# Patient Record
Sex: Male | Born: 1950 | Race: White | Hispanic: No | Marital: Married | Smoking: Never smoker
Health system: Southern US, Community
[De-identification: ages and names within clinical notes are randomized; demographics above are authoritative.]

## PROBLEM LIST (undated history)

## (undated) DIAGNOSIS — N138 Other obstructive and reflux uropathy: Secondary | ICD-10-CM

## (undated) DIAGNOSIS — E785 Hyperlipidemia, unspecified: Secondary | ICD-10-CM

## (undated) DIAGNOSIS — M1990 Unspecified osteoarthritis, unspecified site: Secondary | ICD-10-CM

## (undated) DIAGNOSIS — Z789 Other specified health status: Secondary | ICD-10-CM

## (undated) DIAGNOSIS — Z86018 Personal history of other benign neoplasm: Secondary | ICD-10-CM

## (undated) DIAGNOSIS — I10 Essential (primary) hypertension: Secondary | ICD-10-CM

## (undated) DIAGNOSIS — N401 Enlarged prostate with lower urinary tract symptoms: Secondary | ICD-10-CM

## (undated) DIAGNOSIS — Z87442 Personal history of urinary calculi: Secondary | ICD-10-CM

## (undated) DIAGNOSIS — R339 Retention of urine, unspecified: Secondary | ICD-10-CM

## (undated) DIAGNOSIS — N200 Calculus of kidney: Secondary | ICD-10-CM

## (undated) HISTORY — PX: OTHER SURGICAL HISTORY: SHX169

---

## 2002-10-22 ENCOUNTER — Encounter: Admission: RE | Admit: 2002-10-22 | Discharge: 2002-10-22 | Payer: Self-pay | Admitting: Sports Medicine

## 2003-08-12 ENCOUNTER — Ambulatory Visit (HOSPITAL_COMMUNITY): Admission: RE | Admit: 2003-08-12 | Discharge: 2003-08-12 | Payer: Self-pay | Admitting: Gastroenterology

## 2005-08-06 ENCOUNTER — Emergency Department (HOSPITAL_COMMUNITY): Admission: EM | Admit: 2005-08-06 | Discharge: 2005-08-06 | Payer: Self-pay | Admitting: *Deleted

## 2005-11-07 ENCOUNTER — Other Ambulatory Visit: Admission: RE | Admit: 2005-11-07 | Discharge: 2005-11-07 | Payer: Self-pay | Admitting: Diagnostic Radiology

## 2005-12-31 ENCOUNTER — Encounter (INDEPENDENT_AMBULATORY_CARE_PROVIDER_SITE_OTHER): Payer: Self-pay | Admitting: Specialist

## 2005-12-31 ENCOUNTER — Ambulatory Visit (HOSPITAL_COMMUNITY): Admission: RE | Admit: 2005-12-31 | Discharge: 2006-01-01 | Payer: Self-pay | Admitting: Surgery

## 2005-12-31 HISTORY — PX: OTHER SURGICAL HISTORY: SHX169

## 2006-06-11 ENCOUNTER — Encounter: Admission: RE | Admit: 2006-06-11 | Discharge: 2006-06-11 | Payer: Self-pay | Admitting: Sports Medicine

## 2006-08-10 HISTORY — PX: KNEE ARTHROSCOPY W/ MENISCECTOMY: SHX1879

## 2007-07-16 ENCOUNTER — Encounter: Admission: RE | Admit: 2007-07-16 | Discharge: 2007-07-16 | Payer: Self-pay | Admitting: Orthopaedic Surgery

## 2010-03-11 HISTORY — PX: OTHER SURGICAL HISTORY: SHX169

## 2010-07-27 NOTE — Op Note (Signed)
Ricardo Little, Ricardo Little NO.:  [id number]   MEDICAL RECORD NO.:  [id number]          PATIENT TYPE:  OIB   LOCATION:  1615                         FACILITY:  [facility]   PHYSICIAN:  Sandria Bales. Ezzard Standing, M.D.  DATE OF BIRTH:  Apr 05, 1950   DATE OF PROCEDURE:  12/31/2005  DATE OF DISCHARGE:  01/01/2006                                 OPERATIVE REPORT   PREOPERATIVE DIAGNOSIS:  Left thyroid nodule.  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 Jimmy Little).   OPERATION PERFORMED:  Left thyroid lobectomy.   SURGEON:  Sandria Bales. Ezzard Standing, M.D.   ASSISTANT:  Ardeth Sportsman, MD   ANESTHESIA:  General endotracheal.   ESTIMATED BLOOD LOSS:  75 mL.   DRAINS:  None.   INDICATIONS FOR PROCEDURE:  Ricardo Little is a 60 year old white male who has  been found to have a nodule of his left thyroid lobe measuring about 4 cm in  maximum diameter.  He underwent a needle biopsy at [facility]  which showed follicular cells in a nonspecific pattern and he now comes for  a left thyroid lobectomy.   DESCRIPTION OF PROCEDURE:  The indications and potential complications of  the procedure were explained to the patient.  Potential complications  include but not limited to bleeding, infection, recurrent laryngeal nerve  and parathyroid gland injury and the possibilities of malignancy, total  thyroid gland excision.   OPERATIVE NOTE:  The patient was placed in supine position with a roll under  the shoulders. After the neck was prepped with Betadine solution and  sterilely draped, a collar neck incision was made from the anterior edge of  the left sternocleidomastoid to the anterior edge of the right  sternocleidomastoid muscle.  Sharp dissection carried down through the  platysma identifying the strap muscles which were split in the midline and  then I started dissection over the left thyroid lobe.   I identified the superior vessels of the left thyroid gland.  I ligated them  with a 2-0 silk and placed a medium sized clip on them.  I identified the  middle thyroid vein which was divided using a large clip.  The inferior  thyroid vessels also divided with a clip and a 2-0 silk tie.   I thought I found at least one of the parathyroid glands which was the  superior thyroid gland and the vessels coming out lateral to the thyroid.  I  found the recurrent laryngeal nerve which was kept posterior to dissection  and I thought we stayed away from it during the dissection.  I then got up  the ligaments of Berry attached to the trachea.  These were taken down both  with knife and Bovie electrocautery.  I then got to the isthmus.  The  patient also had a fairly good sized pyramidal lobe of the thyroid which was  probably about 2 or 3 cm in length and this pyramidal lobe was excised with  the left thyroid lobe.   The specimen was then marked with a long suture for the inferior pole, a  short suture for the isthmus and sent to pathology.   Frozen section report by Dr. Jimmy Little showed this to be a Hurthle cell  neoplasm which has benign features with permanent pathology pending.  I  therefore terminated the operation at this time.  I irrigated the neck wound  with saline.  I placed Surgicel in the bed of the thyroid gland.  There was  no evidence of any bleeding or oozing.  The midline strap muscles were  closed with interrupted 3-0 Vicryl suture.  The platysma muscle was closed  with interrupted 3-0 Vicryl suture. The skin closed with a 5-0 Monocryl  suture, painted with tincture of benzoin and steri-stripped.   The patient tolerated the procedure well, was transported to recovery room  in good condition.  Sponge and needle counts were correct at the end of this  case.      Sandria Bales. Ezzard Standing, M.D.  Electronically Signed      DHN/MEDQ  D:  12/31/2005  T:  01/01/2006  Job:  [id number]   cc:   Marjory Lies, M.D.  Fax: [phone]

## 2011-08-22 ENCOUNTER — Other Ambulatory Visit: Payer: Self-pay | Admitting: Gastroenterology

## 2012-01-12 ENCOUNTER — Emergency Department (HOSPITAL_COMMUNITY)
Admission: EM | Admit: 2012-01-12 | Discharge: 2012-01-13 | Disposition: A | Discharge status: Home / Self Care | Payer: BC Managed Care – PPO | Attending: Emergency Medicine | Admitting: Emergency Medicine

## 2012-01-12 ENCOUNTER — Encounter (HOSPITAL_COMMUNITY): Payer: Self-pay

## 2012-01-12 DIAGNOSIS — R1031 Right lower quadrant pain: Secondary | ICD-10-CM | POA: Insufficient documentation

## 2012-01-12 DIAGNOSIS — E079 Disorder of thyroid, unspecified: Secondary | ICD-10-CM | POA: Insufficient documentation

## 2012-01-12 DIAGNOSIS — I10 Essential (primary) hypertension: Secondary | ICD-10-CM | POA: Insufficient documentation

## 2012-01-12 DIAGNOSIS — Z79899 Other long term (current) drug therapy: Secondary | ICD-10-CM | POA: Insufficient documentation

## 2012-01-12 DIAGNOSIS — Z7982 Long term (current) use of aspirin: Secondary | ICD-10-CM | POA: Insufficient documentation

## 2012-01-12 DIAGNOSIS — E7800 Pure hypercholesterolemia, unspecified: Secondary | ICD-10-CM | POA: Insufficient documentation

## 2012-01-12 DIAGNOSIS — Z87442 Personal history of urinary calculi: Secondary | ICD-10-CM | POA: Insufficient documentation

## 2012-01-12 HISTORY — DX: Essential (primary) hypertension: I10

## 2012-01-12 LAB — URINALYSIS, ROUTINE W REFLEX MICROSCOPIC
Bilirubin Urine: NEGATIVE
Ketones, ur: NEGATIVE mg/dL
Nitrite: NEGATIVE
Protein, ur: NEGATIVE mg/dL
Urobilinogen, UA: 0.2 mg/dL (ref 0.0–1.0)

## 2012-01-12 NOTE — ED Notes (Signed)
Pt presents with NAD- c/o of rt flank pain x 1 day-denies kidney stone pain " have had them before and this is not the same".  Pt ?gas but worse.  Pt denies N/V/d and fever

## 2012-01-13 LAB — LIPASE, BLOOD: Lipase: 38 U/L (ref 11–59)

## 2012-01-13 LAB — COMPREHENSIVE METABOLIC PANEL
Alkaline Phosphatase: 69 U/L (ref 39–117)
BUN: 19 mg/dL (ref 6–23)
CO2: 25 mEq/L (ref 19–32)
Chloride: 102 mEq/L (ref 96–112)
GFR calc Af Amer: >90 mL/min (ref >90)
GFR calc non Af Amer: 87 mL/min — ABNORMAL LOW (ref >90)
Glucose, Bld: 97 mg/dL (ref 70–99)
Potassium: 4 mEq/L (ref 3.5–5.1)
Total Bilirubin: 0.6 mg/dL (ref 0.3–1.2)
Total Protein: 7.3 g/dL (ref 6.0–8.3)

## 2012-01-13 LAB — CBC WITH DIFFERENTIAL/PLATELET
Eosinophils Absolute: 0.1 10*3/uL (ref 0.0–0.7)
Hemoglobin: 15.4 g/dL (ref 13.0–17.0)
Lymphs Abs: 1.2 10*3/uL (ref 0.7–4.0)
MCH: 30.7 pg (ref 26.0–34.0)
MCV: 87.3 fL (ref 78.0–100.0)
Monocytes Relative: 14 % — ABNORMAL HIGH (ref 3–12)
Neutrophils Relative %: 61 % (ref 43–77)
RBC: 5.02 MIL/uL (ref 4.22–5.81)

## 2012-01-13 NOTE — ED Provider Notes (Signed)
History     CSN: [id number]  Arrival date & time 01/12/12  2230   First MD Initiated Contact with Patient 01/13/12 0016      Chief Complaint  Patient presents with  . Flank Pain    (Consider location/radiation/quality/duration/timing/severity/associated sxs/prior treatment) HPI This 61 year old male has about 24 hours of gradual onset waxing and waning right lower quadrant dull aching abdominal pain and flank pain which is minimal to mild when it started and became moderately severe but is now almost gone. It was not colicky or sudden or sharp or severe like his prior kidney stone type pains. He is no dysuria or testicular pain. He is no trauma. He is no nausea vomiting diarrhea. He is no chest pain cough or shortness breath. His pain is now gone. He was worried about possibly having appendicitis but realizes the blood counts to not rule in or out appendicitis and realized the CAT scan can miss appendicitis as well. He did have right lower quadrant abdominal pain when he arrived to the emergency room if the pain is now gone. He is no back pain no pain down his legs no weakness or numbness. He is no change in bowel or bladder function. No treatment prior to arrival. Past Medical History  Diagnosis Date  . Kidney stone   . Thyroid disease   . Hypertension   . Hypercholesterolemia     Past Surgical History  Procedure Date  . Knee surgery     No family history on file.  History  Substance Use Topics  . Smoking status: Never Smoker   . Smokeless tobacco: Not on file  . Alcohol Use: Yes      Review of Systems 10 Systems reviewed and are negative for acute change except as noted in the HPI. Allergies  Review of patient's allergies indicates no known allergies.  Home Medications   Current Outpatient Rx  Name  Route  Sig  Dispense  Refill  . AMLODIPINE BESY-BENAZEPRIL HCL 5-20 MG PO CAPS   Oral   Take 1 capsule by mouth daily.         . ASPIRIN EC 81 MG PO TBEC    Oral   Take 162 mg by mouth daily.         . ATORVASTATIN CALCIUM 10 MG PO TABS   Oral   Take 5 mg by mouth daily.         . OMEGA-3 FATTY ACIDS 1000 MG PO CAPS   Oral   Take 2 g by mouth daily.         Marland Kitchen FLAX SEED OIL 1000 MG PO CAPS   Oral   Take 1 capsule by mouth daily.         . ADULT MULTIVITAMIN W/MINERALS CH   Oral   Take 1 tablet by mouth daily.         . SERTRALINE HCL 25 MG PO TABS   Oral   Take 25 mg by mouth daily.           BP 136/79  Pulse 71  Temp 98.1 F (36.7 C) (Oral)  Resp 18  Ht 5\' 9"  (1.753 m)  Wt 218 lb (98.884 kg)  BMI 32.19 kg/m2  SpO2 98%  Physical Exam  Nursing note and vitals reviewed. Constitutional:       Awake, alert, nontoxic appearance.  HENT:  Head: Atraumatic.  Eyes: Right eye exhibits no discharge. Left eye exhibits no discharge.  Neck: Neck supple.  Cardiovascular:  Normal rate and regular rhythm.   No murmur heard. Pulmonary/Chest: Effort normal and breath sounds normal. No respiratory distress. He has no wheezes. He has no rales. He exhibits no tenderness.  Abdominal: Soft. Bowel sounds are normal. He exhibits no distension and no mass. There is no tenderness. There is no rebound and no guarding.  Genitourinary:       No CVA tenderness, testicles descended and nontender, no palpable inguinal hernias.  Musculoskeletal: He exhibits no tenderness.       Baseline ROM, no obvious new focal weakness.  Neurological: He is alert.       Mental status and motor strength appears baseline for patient and situation.  Skin: No rash noted.  Psychiatric: He has a normal mood and affect.    ED Course  Procedures (including critical care time)  Labs Reviewed  CBC WITH DIFFERENTIAL - Abnormal; Notable for the following:    Monocytes Relative 14 (*)     All other components within normal limits  COMPREHENSIVE METABOLIC PANEL - Abnormal; Notable for the following:    GFR calc non Af Amer 87 (*)     All other components  within normal limits  LIPASE, BLOOD  URINALYSIS, ROUTINE W REFLEX MICROSCOPIC  LAB REPORT - SCANNED   No results found.   1. RLQ abdominal pain       MDM  Pt stable in ED with no significant deterioration in condition.  Patient / Family / Caregiver informed of clinical course, understand medical decision-making process, and agree with plan.  I doubt any other EMC precluding discharge at this time including, but not necessarily limited to the following:SBI, peritonitis.       Hurman Horn, MD 01/13/12 [phone]

## 2012-04-25 ENCOUNTER — Other Ambulatory Visit: Payer: Self-pay

## 2012-08-17 ENCOUNTER — Other Ambulatory Visit: Payer: Self-pay | Admitting: Urology

## 2012-08-20 ENCOUNTER — Emergency Department (HOSPITAL_COMMUNITY)
Admission: EM | Admit: 2012-08-20 | Discharge: 2012-08-20 | Disposition: A | Discharge status: Home / Self Care | Payer: BC Managed Care – PPO | Attending: Emergency Medicine | Admitting: Emergency Medicine

## 2012-08-20 ENCOUNTER — Encounter (HOSPITAL_BASED_OUTPATIENT_CLINIC_OR_DEPARTMENT_OTHER): Payer: Self-pay | Admitting: *Deleted

## 2012-08-20 ENCOUNTER — Encounter (HOSPITAL_COMMUNITY): Payer: Self-pay

## 2012-08-20 DIAGNOSIS — R109 Unspecified abdominal pain: Secondary | ICD-10-CM | POA: Insufficient documentation

## 2012-08-20 DIAGNOSIS — N200 Calculus of kidney: Secondary | ICD-10-CM | POA: Insufficient documentation

## 2012-08-20 DIAGNOSIS — E785 Hyperlipidemia, unspecified: Secondary | ICD-10-CM | POA: Insufficient documentation

## 2012-08-20 DIAGNOSIS — R319 Hematuria, unspecified: Secondary | ICD-10-CM | POA: Insufficient documentation

## 2012-08-20 DIAGNOSIS — Z7982 Long term (current) use of aspirin: Secondary | ICD-10-CM | POA: Insufficient documentation

## 2012-08-20 DIAGNOSIS — Z8639 Personal history of other endocrine, nutritional and metabolic disease: Secondary | ICD-10-CM | POA: Insufficient documentation

## 2012-08-20 DIAGNOSIS — R0682 Tachypnea, not elsewhere classified: Secondary | ICD-10-CM | POA: Insufficient documentation

## 2012-08-20 DIAGNOSIS — N201 Calculus of ureter: Secondary | ICD-10-CM | POA: Insufficient documentation

## 2012-08-20 DIAGNOSIS — M549 Dorsalgia, unspecified: Secondary | ICD-10-CM | POA: Insufficient documentation

## 2012-08-20 DIAGNOSIS — R112 Nausea with vomiting, unspecified: Secondary | ICD-10-CM | POA: Insufficient documentation

## 2012-08-20 DIAGNOSIS — M1710 Unilateral primary osteoarthritis, unspecified knee: Secondary | ICD-10-CM | POA: Insufficient documentation

## 2012-08-20 DIAGNOSIS — Z862 Personal history of diseases of the blood and blood-forming organs and certain disorders involving the immune mechanism: Secondary | ICD-10-CM | POA: Insufficient documentation

## 2012-08-20 DIAGNOSIS — I10 Essential (primary) hypertension: Secondary | ICD-10-CM | POA: Insufficient documentation

## 2012-08-20 DIAGNOSIS — Z79899 Other long term (current) drug therapy: Secondary | ICD-10-CM | POA: Insufficient documentation

## 2012-08-20 LAB — URINE MICROSCOPIC-ADD ON

## 2012-08-20 LAB — URINALYSIS, ROUTINE W REFLEX MICROSCOPIC
Glucose, UA: NEGATIVE mg/dL
Leukocytes, UA: NEGATIVE
Protein, ur: NEGATIVE mg/dL

## 2012-08-20 MED ORDER — HYDROMORPHONE HCL PF 1 MG/ML IJ SOLN
1.0000 mg | Freq: Once | INTRAMUSCULAR | Status: AC
  Administered 2012-08-20: 1 mg via INTRAMUSCULAR
  Filled 2012-08-20: qty 1

## 2012-08-20 MED ORDER — OXYCODONE-ACETAMINOPHEN 5-325 MG PO TABS: 1.0000 | ORAL_TABLET | Freq: Four times a day (QID) | ORAL | Status: DC | PRN

## 2012-08-20 NOTE — ED Provider Notes (Signed)
History     CSN: [id number]  Arrival date & time 08/20/12  1842   First MD Initiated Contact with Patient 08/20/12 1939      Chief Complaint  Patient presents with  . Flank Pain    (Consider location/radiation/quality/duration/timing/severity/associated sxs/prior treatment) HPI Comments: 62 year old male with a past medical history of kidney stones and right ureteral stone scheduled to be removed next week on Thursday at [facility] by Dr. Retta Diones presents to the emergency department with his wife complaining of worsening bilateral flank pain, left worse than right. Pain described as constant, dull throbbing with sharpness waxing and waning rated 10 out of 10 at times. States he has kidney stones about the left than the right, the right are very large and in his ureter. He has Vicodin at home which is expired, took 2 of them without relief. Admits to associated hematuria. Denies increased urinary frequency, urgency or dysuria. The pain became so severe earlier today it went up vomiting. Denies nausea.  Patient is a 62 y.o. male presenting with flank pain. The history is provided by the patient and the spouse.  Flank Pain Associated symptoms include nausea and vomiting. Pertinent negatives include no abdominal pain, chills or fever.    Past Medical History  Diagnosis Date  . Hypertension   . Right ureteral stone   . History of benign thyroid tumor   . Arthritis     KNEE  . Hyperlipidemia     Past Surgical History  Procedure Laterality Date  . Knee arthroscopy w/ meniscectomy  JUNE 2008  . Left thyroid lobectomy  12-31-2005    HURTHLE CELL TUMOR    History reviewed. No pertinent family history.  History  Substance Use Topics  . Smoking status: Never Smoker   . Smokeless tobacco: Never Used  . Alcohol Use: Yes     Comment: RARE      Review of Systems  Constitutional: Negative for fever and chills.  Gastrointestinal: Positive for nausea and vomiting. Negative  for abdominal pain.  Genitourinary: Positive for hematuria and flank pain. Negative for dysuria, urgency, frequency and difficulty urinating.  Musculoskeletal: Positive for back pain.  All other systems reviewed and are negative.    Allergies  Review of patient's allergies indicates no known allergies.  Home Medications   Current Outpatient Rx  Name  Route  Sig  Dispense  Refill  . amLODipine-benazepril (LOTREL) 5-20 MG per capsule   Oral   Take 1 capsule by mouth daily.         Marland Kitchen aspirin EC 81 MG tablet   Oral   Take 81 mg by mouth daily.          Marland Kitchen atorvastatin (LIPITOR) 10 MG tablet   Oral   Take 5 mg by mouth daily.         . fexofenadine-pseudoephedrine (ALLEGRA-D 24) 180-240 MG per 24 hr tablet   Oral   Take 1 tablet by mouth daily.         . fish oil-omega-3 fatty acids 1000 MG capsule   Oral   Take 2 g by mouth daily.         . Flaxseed, Linseed, (FLAX SEED OIL) 1000 MG CAPS   Oral   Take 1 capsule by mouth daily.         . Multiple Vitamin (MULTIVITAMIN WITH MINERALS) TABS   Oral   Take 1 tablet by mouth daily.         . sertraline (ZOLOFT) 50  MG tablet   Oral   Take 25 mg by mouth daily.         . tamsulosin (FLOMAX) 0.4 MG CAPS   Oral   Take 0.4 mg by mouth daily.           BP 151/83  Pulse 65  Temp(Src) 98.7 F (37.1 C) (Oral)  Resp 24  Ht 5\' 9"  (1.753 m)  Wt 220 lb (99.791 kg)  BMI 32.47 kg/m2  SpO2 98%  Physical Exam  Nursing note and vitals reviewed. Constitutional: He is oriented to person, place, and time. He appears well-developed and well-nourished.  Appears uncomfortable.  HENT:  Head: Normocephalic and atraumatic.  Mouth/Throat: Oropharynx is clear and moist.  Eyes: Conjunctivae are normal.  Neck: Normal range of motion. Neck supple.  Cardiovascular: Normal rate, regular rhythm, normal heart sounds and intact distal pulses.   Pulmonary/Chest: Breath sounds normal. Tachypnea noted. He has no decreased breath  sounds. He has no wheezes. He has no rhonchi. He has no rales.  Abdominal: Soft. Normal appearance and bowel sounds are normal. He exhibits no distension and no mass. There is no tenderness. There is CVA tenderness (L>R). There is no rigidity, no rebound and no guarding.  Musculoskeletal: Normal range of motion. He exhibits no edema.  Neurological: He is alert and oriented to person, place, and time.  Skin: Skin is warm and dry. He is not diaphoretic.  Psychiatric: He has a normal mood and affect. His behavior is normal.    ED Course  Procedures (including critical care time)  Labs Reviewed  URINALYSIS, ROUTINE W REFLEX MICROSCOPIC - Abnormal; Notable for the following:    APPearance CLOUDY (*)    Hgb urine dipstick LARGE (*)    All other components within normal limits  URINE MICROSCOPIC-ADD ON   No results found.   1. Kidney stone   2. Ureteral stone   3. Flank pain       MDM  62 y/o male with known kidney stones, worsening pain, expired pain medication at home. Scheduled for procedure next Thursday with [facility]. Pain controlled in ED with dilaudid IM. No UTI. He is in NAD. Stable for discharge. Rx percocet for pain. Will call Dr. Retta Diones tomorrow. Return precautions discussed. Patient states understanding of plan and is agreeable.        Trevor Mace, PA-C 08/20/12 2148

## 2012-08-20 NOTE — Progress Notes (Signed)
NPO AFTER MN WITH EXCEPTION CLEAR LIQUIDS UNTIL 0830 (NO CREAM/ MILK PRODUCTS). ARRIVE AT 1315. NEEDS ISTAT AND EKG.

## 2012-08-20 NOTE — ED Notes (Signed)
Pt dx with kidney stones on the right side, has an appt next Thursday with [facility], today the pain began to get worse and he has vomited

## 2012-08-20 NOTE — ED Provider Notes (Signed)
Medical screening examination/treatment/procedure(s) were performed by non-physician practitioner and as supervising physician I was immediately available for consultation/collaboration.   Evan Osburn H Nattaly Yebra, MD 08/20/12 2318 

## 2012-08-27 ENCOUNTER — Ambulatory Visit (HOSPITAL_COMMUNITY): Payer: BC Managed Care – PPO

## 2012-08-27 ENCOUNTER — Ambulatory Visit (HOSPITAL_BASED_OUTPATIENT_CLINIC_OR_DEPARTMENT_OTHER): Payer: BC Managed Care – PPO | Admitting: Certified Registered"

## 2012-08-27 ENCOUNTER — Ambulatory Visit (HOSPITAL_BASED_OUTPATIENT_CLINIC_OR_DEPARTMENT_OTHER)
Admission: RE | Admit: 2012-08-27 | Discharge: 2012-08-27 | Disposition: A | Discharge status: Home / Self Care | Payer: BC Managed Care – PPO | Source: Ambulatory Visit | Attending: Urology | Admitting: Urology

## 2012-08-27 ENCOUNTER — Encounter (HOSPITAL_BASED_OUTPATIENT_CLINIC_OR_DEPARTMENT_OTHER): Payer: Self-pay | Admitting: *Deleted

## 2012-08-27 ENCOUNTER — Encounter (HOSPITAL_BASED_OUTPATIENT_CLINIC_OR_DEPARTMENT_OTHER): Payer: Self-pay | Admitting: Certified Registered"

## 2012-08-27 ENCOUNTER — Encounter (HOSPITAL_BASED_OUTPATIENT_CLINIC_OR_DEPARTMENT_OTHER)
Admission: RE | Disposition: A | Discharge status: Home / Self Care | Payer: Self-pay | Source: Ambulatory Visit | Attending: Urology

## 2012-08-27 DIAGNOSIS — I10 Essential (primary) hypertension: Secondary | ICD-10-CM | POA: Insufficient documentation

## 2012-08-27 DIAGNOSIS — E785 Hyperlipidemia, unspecified: Secondary | ICD-10-CM | POA: Insufficient documentation

## 2012-08-27 DIAGNOSIS — N201 Calculus of ureter: Secondary | ICD-10-CM

## 2012-08-27 HISTORY — PX: CYSTOSCOPY WITH RETROGRADE PYELOGRAM, URETEROSCOPY AND STENT PLACEMENT: SHX5789

## 2012-08-27 HISTORY — DX: Unspecified osteoarthritis, unspecified site: M19.90

## 2012-08-27 HISTORY — PX: HOLMIUM LASER APPLICATION: SHX5852

## 2012-08-27 HISTORY — DX: Hyperlipidemia, unspecified: E78.5

## 2012-08-27 HISTORY — DX: Personal history of other benign neoplasm: Z86.018

## 2012-08-27 LAB — POCT I-STAT 4, (NA,K, GLUC, HGB,HCT): Glucose, Bld: 97 mg/dL (ref 70–99)

## 2012-08-27 SURGERY — CYSTOURETEROSCOPY, WITH RETROGRADE PYELOGRAM AND STENT INSERTION
Anesthesia: General | Site: Ureter | Laterality: Bilateral | Wound class: Clean Contaminated

## 2012-08-27 MED ORDER — ONDANSETRON HCL 4 MG/2ML IJ SOLN
4.0000 mg | Freq: Four times a day (QID) | INTRAMUSCULAR | Status: DC | PRN
  Filled 2012-08-27: qty 2

## 2012-08-27 MED ORDER — LIDOCAINE HCL (CARDIAC) 20 MG/ML IV SOLN
INTRAVENOUS | Status: DC | PRN
  Administered 2012-08-27: 80 mg via INTRAVENOUS

## 2012-08-27 MED ORDER — SODIUM CHLORIDE 0.9 % IJ SOLN
3.0000 mL | Freq: Two times a day (BID) | INTRAMUSCULAR | Status: DC
  Filled 2012-08-27: qty 3

## 2012-08-27 MED ORDER — CIPROFLOXACIN IN D5W 400 MG/200ML IV SOLN
400.0000 mg | INTRAVENOUS | Status: AC
  Administered 2012-08-27: 400 mg via INTRAVENOUS
  Filled 2012-08-27: qty 200

## 2012-08-27 MED ORDER — FENTANYL CITRATE 0.05 MG/ML IJ SOLN
INTRAMUSCULAR | Status: DC | PRN
  Administered 2012-08-27: 100 ug via INTRAVENOUS
  Administered 2012-08-27: 50 ug via INTRAVENOUS
  Administered 2012-08-27: 25 ug via INTRAVENOUS
  Administered 2012-08-27 (×2): 50 ug via INTRAVENOUS
  Administered 2012-08-27: 25 ug via INTRAVENOUS

## 2012-08-27 MED ORDER — ACETAMINOPHEN 650 MG RE SUPP
650.0000 mg | RECTAL | Status: DC | PRN
  Filled 2012-08-27: qty 1

## 2012-08-27 MED ORDER — OXYBUTYNIN CHLORIDE 5 MG PO TABS: 5.0000 mg | ORAL_TABLET | Freq: Three times a day (TID) | ORAL | Status: DC

## 2012-08-27 MED ORDER — PROMETHAZINE HCL 25 MG/ML IJ SOLN
6.2500 mg | INTRAMUSCULAR | Status: DC | PRN
  Filled 2012-08-27: qty 1

## 2012-08-27 MED ORDER — OXYCODONE-ACETAMINOPHEN 5-325 MG PO TABS: 1.0000 | ORAL_TABLET | Freq: Four times a day (QID) | ORAL | Status: DC | PRN

## 2012-08-27 MED ORDER — CIPROFLOXACIN HCL 250 MG PO TABS: 250.0000 mg | ORAL_TABLET | Freq: Two times a day (BID) | ORAL | Status: DC

## 2012-08-27 MED ORDER — SODIUM CHLORIDE 0.9 % IJ SOLN
3.0000 mL | INTRAMUSCULAR | Status: DC | PRN
  Filled 2012-08-27: qty 3

## 2012-08-27 MED ORDER — DEXAMETHASONE SODIUM PHOSPHATE 4 MG/ML IJ SOLN
INTRAMUSCULAR | Status: DC | PRN
  Administered 2012-08-27: 8 mg via INTRAVENOUS

## 2012-08-27 MED ORDER — ONDANSETRON HCL 4 MG/2ML IJ SOLN
INTRAMUSCULAR | Status: DC | PRN
  Administered 2012-08-27: 4 mg via INTRAVENOUS

## 2012-08-27 MED ORDER — PROPOFOL 10 MG/ML IV BOLUS
INTRAVENOUS | Status: DC | PRN
  Administered 2012-08-27: 200 mg via INTRAVENOUS

## 2012-08-27 MED ORDER — FENTANYL CITRATE 0.05 MG/ML IJ SOLN
25.0000 ug | INTRAMUSCULAR | Status: DC | PRN
  Filled 2012-08-27: qty 1

## 2012-08-27 MED ORDER — LACTATED RINGERS IV SOLN
INTRAVENOUS | Status: DC
  Administered 2012-08-27: 100 mL/h via INTRAVENOUS
  Administered 2012-08-27: 16:00:00 via INTRAVENOUS
  Filled 2012-08-27: qty 1000

## 2012-08-27 MED ORDER — KETOROLAC TROMETHAMINE 30 MG/ML IJ SOLN
INTRAMUSCULAR | Status: DC | PRN
  Administered 2012-08-27: 30 mg via INTRAVENOUS

## 2012-08-27 MED ORDER — SODIUM CHLORIDE 0.9 % IR SOLN
Status: DC | PRN
  Administered 2012-08-27: 6000 mL

## 2012-08-27 MED ORDER — IOHEXOL 350 MG/ML SOLN
INTRAVENOUS | Status: DC | PRN
  Administered 2012-08-27: 17 mL

## 2012-08-27 MED ORDER — STERILE WATER FOR IRRIGATION IR SOLN
Status: DC | PRN
  Administered 2012-08-27: 1000 mL

## 2012-08-27 MED ORDER — MEPERIDINE HCL 25 MG/ML IJ SOLN
6.2500 mg | INTRAMUSCULAR | Status: DC | PRN
  Filled 2012-08-27: qty 1

## 2012-08-27 MED ORDER — SODIUM CHLORIDE 0.9 % IV SOLN
250.0000 mL | INTRAVENOUS | Status: DC | PRN
Start: 2012-08-27 — End: 2012-08-27
  Filled 2012-08-27: qty 250

## 2012-08-27 MED ORDER — ACETAMINOPHEN 325 MG PO TABS
650.0000 mg | ORAL_TABLET | ORAL | Status: DC | PRN
  Filled 2012-08-27: qty 2

## 2012-08-27 MED ORDER — LACTATED RINGERS IV SOLN
INTRAVENOUS | Status: DC
  Filled 2012-08-27: qty 1000

## 2012-08-27 MED ORDER — OXYCODONE HCL 5 MG PO TABS
5.0000 mg | ORAL_TABLET | ORAL | Status: DC | PRN
  Filled 2012-08-27: qty 2

## 2012-08-27 SURGICAL SUPPLY — 25 items
ADAPTER CATH URET PLST 4-6FR (CATHETERS) IMPLANT
BAG DRAIN URO-CYSTO SKYTR STRL (DRAIN) ×2 IMPLANT
BAG URINE LEG 19OZ MD ST LTX (BAG) ×2 IMPLANT
BASKET ZERO TIP NITINOL 2.4FR (BASKET) ×4 IMPLANT
CANISTER SUCT LVC 12 LTR MEDI- (MISCELLANEOUS) ×2 IMPLANT
CATH INTERMIT  6FR 70CM (CATHETERS) ×2 IMPLANT
CLOTH BEACON ORANGE TIMEOUT ST (SAFETY) ×2 IMPLANT
DRAPE CAMERA CLOSED 9X96 (DRAPES) ×2 IMPLANT
GLOVE BIO SURGEON STRL SZ 6.5 (GLOVE) ×2 IMPLANT
GLOVE BIO SURGEON STRL SZ8 (GLOVE) ×2 IMPLANT
GLOVE INDICATOR 7.0 STRL GRN (GLOVE) ×2 IMPLANT
GOWN PREVENTION PLUS LG XLONG (DISPOSABLE) ×2 IMPLANT
GOWN STRL REIN XL XLG (GOWN DISPOSABLE) ×2 IMPLANT
GUIDEWIRE 0.038 PTFE COATED (WIRE) IMPLANT
GUIDEWIRE ANG ZIPWIRE 038X150 (WIRE) ×2 IMPLANT
GUIDEWIRE STR DUAL SENSOR (WIRE) ×2 IMPLANT
IV NS IRRIG 3000ML ARTHROMATIC (IV SOLUTION) ×4 IMPLANT
KIT BALLIN UROMAX 15FX10 (LABEL) ×1 IMPLANT
LASER FIBER DISP (UROLOGICAL SUPPLIES) ×2 IMPLANT
NS IRRIG 500ML POUR BTL (IV SOLUTION) IMPLANT
PACK CYSTOSCOPY (CUSTOM PROCEDURE TRAY) ×2 IMPLANT
SET HIGH PRES BAL DIL (LABEL) ×1
SHEATH ACCESS URETERAL 54CM (SHEATH) ×2 IMPLANT
STENT URET 6FRX24 CONTOUR (STENTS) ×4 IMPLANT
SYRINGE 10CC LL (SYRINGE) ×2 IMPLANT

## 2012-08-27 NOTE — Anesthesia Procedure Notes (Signed)
Procedure Name: LMA Insertion Date/Time: 08/27/2012 2:55 PM Performed by: Renella Cunas D Pre-anesthesia Checklist: Patient identified, Emergency Drugs available, Suction available and Patient being monitored Patient Re-evaluated:Patient Re-evaluated prior to inductionOxygen Delivery Method: Circle System Utilized Preoxygenation: Pre-oxygenation with 100% oxygen Intubation Type: IV induction Ventilation: Mask ventilation without difficulty LMA: LMA inserted LMA Size: 4.0 Number of attempts: 1 Airway Equipment and Method: bite block Placement Confirmation: positive ETCO2 Tube secured with: Tape Dental Injury: Teeth and Oropharynx as per pre-operative assessment

## 2012-08-27 NOTE — Anesthesia Postprocedure Evaluation (Signed)
Anesthesia Post Note  Patient: Ricardo Little  Procedure(s) Performed: Procedure(s) (LRB): CYSTOSCOPY WITH BILATERAL RETROGRADE PYELOGRAM, BILATERAL URETEROSCOPY , STENT PLACEMENT   (Bilateral) HOLMIUM LASER EXTRACTION OF STONES (Bilateral)  Anesthesia type: General  Patient location: PACU  Post pain: Pain level controlled  Post assessment: Post-op Vital signs reviewed  Last Vitals: BP 161/96  Pulse 69  Temp(Src) 37.2 C (Oral)  Resp 14  Ht 5\' 9"  (1.753 m)  Wt 209 lb 5 oz (94.944 kg)  BMI 30.9 kg/m2  SpO2 93%  Post vital signs: Reviewed  Level of consciousness: sedated  Complications: No apparent anesthesia complications

## 2012-08-27 NOTE — Interval H&P Note (Signed)
Pt still having left flank pain. He consents to bilateral ureteroscopy.

## 2012-08-27 NOTE — Op Note (Signed)
Preoperative diagnosis: Bilateral ureteral calculi  Postoperative diagnosis: Same   Procedure: Cystoscopy, bilateral retrograde ureteropyelograms with interpretive fluoroscopy, bilateral ureteroscopy  , holmium laser and extraction of bilateral ureteral calculi, bilateral double-J stent placements  Surgeon: Bertram Millard. Corderius Saraceni, M.D.   Anesthesia: Gen.   Complications: None  Specimen(s): Stone fragments, the patient  Drain(s): Bilateral double-J stent-24 cm x 6 French contour stent with string  Indications: 62 year-old male with recently symptomatic left ureteral stone, and persistent right distal ureteral stone. He was scheduled for elective procedure today with just his right ureteral stone, but he dropped his left ureteral stone within the past few days and has had significant pain. He presents for bilateral ureteroscopy and stone extraction.    Technique and findings: The patient was properly identified in the holding area and received preoperative IV antibiotics. He was taken the operating room where general anesthetic was administered with the LMA. He is placed in the dorsolithotomy position. Genitalia and perineum were prepped and draped. Proper timeout was then performed.  I then passed a 22 French panendoscope through his urethra. Urethra was normal, prostatic urethra was moderately obstructive with the median lobe. The bladder was entered and inspected circumferentially. No tumors were noted. There were moderate trabeculations and multiple small stones layering posteriorly on the  bladder.  I then cannulated the right ureteral orifice with an open-ended catheter. Retrograde ureteropyelogram was performed.  This revealed a filling defect in the distal ureter about 2 cm up. Proximal to this there was no significant hydronephrosis.  A left retrograde was performed. This revealed a normal ureter up to the mid ureter, were filling defect consistent with the previously diagnosed 6 mm  stone was noted. There was moderate hydroureteronephrosis proximal to this.  I then passed a guidewire up the right ureter with the assistance of an open-ended catheter. I then passed a 6 French ureteroscope up to the stone, fragmented it with the holmium laser, and extracted small fragments of the bladder. Inspection of the ureter at this point revealed no further stones.  Attention was then turned to the left ureteral stone. I passed a rigid ureteroscope as far as it would allow, without seeing the stone. At this point, felt that the stone most likely went up into the kidney. I dilated the ureter with both the inner core of a ureteral access sheath as well as a 10 cm, 15 French balloon dilator. Eventually, I negotiated the long ureteral access catheter up to the proximal ureter. The digital flexible ureteroscope was advanced into the renal pelvis. Small blood clots were picked out with the Nitinol basket. I then identified, posteriorly in the posterior calyx in the mid kidney, the stone. I was unable to fragmented due to the patient's respiratory excursion. I then snared the stone and brought down to the ureteral access sheath. I moved the access sheath a little distally in the ureter. Once this was done, I was unable to bring the stone into the ureteral access sheath. I then disassembled the basket, removed the access sheath, and then passed a rigid ureteroscope up to the stone. The stone was fragmented, the previous stone basket was removed, and then using another basket, I negotiated all of the stone fragments into the bladder. Inspection of the ureter again revealed no further stones present.  I then, using a Toomey syringe, irrigated as many fragments out of the bladder as possible. I irrigated for approximately 10 minutes. The fragments were saved. I then passed, over sensor-tip guidewire, double-J  stent 6 (24 cm x 6 [place] with a tether on) up into both ureters using fluoroscopic and cystoscopic  guidance. I let the tethers on. It into the bladder and then removed the cystoscope. I then placed an 49 French Foley catheter and hooked this to dependent drainage.  The patient tolerated procedure well. He was awakened and taken to the PACU in stable condition.

## 2012-08-27 NOTE — Anesthesia Preprocedure Evaluation (Signed)
Anesthesia Evaluation  Patient identified by MRN, date of birth, ID band Patient awake    Reviewed: Allergy & Precautions, H&P , NPO status , Patient's Chart, lab work & pertinent test results  Airway Mallampati: II TM Distance: >3 FB Neck ROM: full    Dental no notable dental hx.    Pulmonary neg pulmonary ROS,  breath sounds clear to auscultation  Pulmonary exam normal       Cardiovascular Exercise Tolerance: Good negative cardio ROS  Rhythm:regular Rate:Normal     Neuro/Psych negative neurological ROS  negative psych ROS   GI/Hepatic negative GI ROS, Neg liver ROS,   Endo/Other  negative endocrine ROS  Renal/GU negative Renal ROS  negative genitourinary   Musculoskeletal   Abdominal   Peds  Hematology negative hematology ROS (+)   Anesthesia Other Findings   Reproductive/Obstetrics negative OB ROS                           Anesthesia Physical Anesthesia Plan  ASA: I  Anesthesia Plan: General   Post-op Pain Management:    Induction:   Airway Management Planned:   Additional Equipment:   Intra-op Plan:   Post-operative Plan:   Informed Consent: I have reviewed the patients History and Physical, chart, labs and discussed the procedure including the risks, benefits and alternatives for the proposed anesthesia with the patient or authorized representative who has indicated his/her understanding and acceptance.   Dental Advisory Given  Plan Discussed with: CRNA  Anesthesia Plan Comments:         Anesthesia Quick Evaluation

## 2012-08-27 NOTE — Transfer of Care (Signed)
Immediate Anesthesia Transfer of Care Note  Patient: Ricardo Little  Procedure(s) Performed: Procedure(s) (LRB): CYSTOSCOPY WITH BILATERAL RETROGRADE PYELOGRAM, BILATERAL URETEROSCOPY , STENT PLACEMENT   (Bilateral) HOLMIUM LASER EXTRACTION OF STONES (Bilateral)  Patient Location: PACU  Anesthesia Type: General  Level of Consciousness: awake, oriented, sedated and patient cooperative  Airway & Oxygen Therapy: Patient Spontanous Breathing and Patient connected to face mask oxygen  Post-op Assessment: Report given to PACU RN and Post -op Vital signs reviewed and stable  Post vital signs: Reviewed and stable  Complications: No apparent anesthesia complications

## 2012-08-27 NOTE — H&P (Signed)
  Urology History and Physical Exam  CC: Kidney stones  HPI: 62 year old male presents at this time for definitive management of ureteral calculi. He has been trying to pass an asymptomatic 6 mm right distal ureteral stone for a few months. Unfortunately, he has had nonprogression, and recent followup revealed this persistent stone. Was recommended to have ureteroscopic stone extraction using the laser. He became symptomatic with a 6 mm proximal ureteral stone on the left recently. He saw Dr. Isabel Caprice in our office a couple of days ago, and was found to have the stone. It was recommended that he have that stone management of the same time.  PMH: Past Medical History  Diagnosis Date  . Hypertension   . Right ureteral stone   . History of benign thyroid tumor   . Arthritis     KNEE  . Hyperlipidemia     PSH: Past Surgical History  Procedure Laterality Date  . Knee arthroscopy w/ meniscectomy  JUNE 2008  . 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 Marital Status: Married    Spouse Name: N/A    Number of Children: N/A  . Years of Education: N/A   Occupational History  . Not on file.   Social History Main Topics  . Smoking status: Never Smoker   . Smokeless tobacco: Never Used  . Alcohol Use: Yes     Comment: RARE  . Drug Use: No  . Sexually Active: Not on file   Other Topics Concern  . Not on file   Social History Narrative  . No narrative on file    Family History: History reviewed. No pertinent family history.  Review of Systems: Positive: Left flank pain Negative:   A further 10 point review of systems was negative except what is listed in the HPI.  Physical Exam: @VITALS2 @ General: No acute distress.  Awake. Head:  Normocephalic.  Atraumatic. ENT:  EOMI.  Mucous membranes moist Neck:  Supple.  No  lymphadenopathy. CV:  S1 present. S2 present. Regular rate. Pulmonary: Equal effort bilaterally.  Clear to auscultation bilaterally. Abdomen: Soft.  Non- tender to palpation. Skin:  Normal turgor.  No visible rash. Extremity: No gross deformity of bilateral upper extremities.  No gross deformity of    bilateral lower extremities. Neurologic: Alert. Appropriate mood.   Studies:  No results found for this basename: HGB, WBC, PLT,  in the last 72 hours  No results found for this basename: NA, K, CL, CO2, BUN, CREATININE, CALCIUM, MAGNESIUM, GFRNONAA, GFRAA,  in the last 72 hours   No results found for this basename: PT, INR, APTT,  in the last 72 hours   No components found with this basename: ABG,     Assessment:  Bilateral ureteral calculi  Plan: Anesthetic cystoscopy, bilateral retrograde, bilateral ureteroscopic stone management with holmium laser lithotripsy and possible stent. The procedure has been discussed with the patient. He understands the risks and complications of this, and desires to proceed

## 2012-08-31 ENCOUNTER — Encounter (HOSPITAL_BASED_OUTPATIENT_CLINIC_OR_DEPARTMENT_OTHER): Payer: Self-pay | Admitting: Urology

## 2012-09-04 ENCOUNTER — Emergency Department (HOSPITAL_COMMUNITY)
Admission: EM | Admit: 2012-09-04 | Discharge: 2012-09-04 | Discharge status: Against Medical Advice | Payer: BC Managed Care – PPO | Attending: Emergency Medicine | Admitting: Emergency Medicine

## 2012-09-04 DIAGNOSIS — Z008 Encounter for other general examination: Secondary | ICD-10-CM | POA: Insufficient documentation

## 2012-09-04 NOTE — ED Notes (Signed)
No unable to be found in [facility]

## 2012-09-04 NOTE — ED Notes (Signed)
No answer in [facility]

## 2012-09-05 ENCOUNTER — Ambulatory Visit (INDEPENDENT_AMBULATORY_CARE_PROVIDER_SITE_OTHER): Payer: BC Managed Care – PPO | Admitting: Family Medicine

## 2012-09-05 VITALS — BP 142/80 | HR 85 | Temp 97.3°F | Resp 18 | Ht 70.5 in | Wt 198.0 lb

## 2012-09-05 DIAGNOSIS — E860 Dehydration: Secondary | ICD-10-CM

## 2012-09-05 DIAGNOSIS — E785 Hyperlipidemia, unspecified: Secondary | ICD-10-CM | POA: Insufficient documentation

## 2012-09-05 DIAGNOSIS — N200 Calculus of kidney: Secondary | ICD-10-CM

## 2012-09-05 DIAGNOSIS — E789 Disorder of lipoprotein metabolism, unspecified: Secondary | ICD-10-CM

## 2012-09-05 DIAGNOSIS — I10 Essential (primary) hypertension: Secondary | ICD-10-CM

## 2012-09-05 DIAGNOSIS — K219 Gastro-esophageal reflux disease without esophagitis: Secondary | ICD-10-CM

## 2012-09-05 LAB — POCT CBC
Granulocyte percent: 77.4 %G (ref 37–80)
HCT, POC: 38.5 % — AB (ref 43.5–53.7)
Hemoglobin: 12.4 g/dL — AB (ref 14.1–18.1)
Lymph, poc: 1.3 (ref 0.6–3.4)
MCH, POC: 29.7 pg (ref 27–31.2)
MCHC: 32.2 g/dL (ref 31.8–35.4)
MCV: 92 fL (ref 80–97)
MID (cbc): 0.5 (ref 0–0.9)
MPV: 9.1 fL (ref 0–99.8)
POC Granulocyte: 6.1 (ref 2–6.9)
POC LYMPH PERCENT: 15.9 %L (ref 10–50)
POC MID %: 6.7 %M (ref 0–12)
Platelet Count, POC: 266 10*3/uL (ref 142–424)
RBC: 4.18 M/uL — AB (ref 4.69–6.13)
RDW, POC: 13.1 %
WBC: 7.9 10*3/uL (ref 4.6–10.2)

## 2012-09-05 LAB — COMPREHENSIVE METABOLIC PANEL
ALT: 11 U/L (ref 0–53)
AST: 12 U/L (ref 0–37)
Albumin: 3.5 g/dL (ref 3.5–5.2)
Alkaline Phosphatase: 59 U/L (ref 39–117)
BUN: 21 mg/dL (ref 6–23)
CO2: 24 mEq/L (ref 19–32)
Calcium: 8.8 mg/dL (ref 8.4–10.5)
Chloride: 103 mEq/L (ref 96–112)
Creat: 1.31 mg/dL (ref 0.50–1.35)
Glucose, Bld: 100 mg/dL — ABNORMAL HIGH (ref 70–99)
Potassium: 4.3 mEq/L (ref 3.5–5.3)
Sodium: 138 mEq/L (ref 135–145)
Total Bilirubin: 0.7 mg/dL (ref 0.3–1.2)
Total Protein: 6.2 g/dL (ref 6.0–8.3)

## 2012-09-05 LAB — POCT UA - MICROSCOPIC ONLY
Casts, Ur, LPF, POC: NEGATIVE
Crystals, Ur, HPF, POC: NEGATIVE
Mucus, UA: NEGATIVE
Yeast, UA: NEGATIVE

## 2012-09-05 LAB — POCT URINALYSIS DIPSTICK
Bilirubin, UA: NEGATIVE
Glucose, UA: NEGATIVE
Nitrite, UA: NEGATIVE
Spec Grav, UA: 1.02
Urobilinogen, UA: 0.2
pH, UA: 7

## 2012-09-05 MED ORDER — OMEPRAZOLE 40 MG PO CPDR: 40.0000 mg | DELAYED_RELEASE_CAPSULE | Freq: Every day | ORAL | Status: DC

## 2012-09-05 NOTE — Progress Notes (Signed)
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 Patient has recently undergone lithotripsy by Dr. Retta Diones after having stones in both sides. Since that time, patient has lost 20 pounds with severe reflux, nausea, constipation, and subsequent dehydration. He's still able to pee and he said no hematuria, but he comes in in extremis.  Patient also has history of hyper lipidemia and hypertension. He's had no heart problems that he knows about. Is having no abdominal or flank pain at present but he has been weak and lightheaded. Patient went to the emergency room yesterday but got tired of waiting and left.  Objective: Patient is shaking, tearful, and clutching his chest. HEENT: Unremarkable Chest: Clear Heart: Regular no murmur Abdomen: Soft, and no HSM, no masses, nontender. CVAT is negative Skin: Some tenting of the dorsal hand, cool and dry Extremities: Unremarkable Patient was given a GI cocktail immediately with at least 50% reduction in reflux pain. Results for orders placed in visit on 09/05/12  POCT UA - MICROSCOPIC ONLY      Result Value Range   WBC, Ur, HPF, POC 5-10     RBC, urine, microscopic 40-50     Bacteria, U Microscopic trace     Mucus, UA neg     Epithelial cells, urine per micros 1-3     Crystals, Ur, HPF, POC neg     Casts, Ur, LPF, POC neg     Yeast, UA neg    POCT URINALYSIS DIPSTICK      Result Value Range   Color, UA yellow     Clarity, UA hazy     Glucose, UA neg     Bilirubin, UA neg     Ketones, UA trace     Spec Grav, UA 1.020     Blood, UA large     pH, UA 7.0     Protein, UA trace     Urobilinogen, UA 0.2     Nitrite, UA neg     Leukocytes, UA small (1+)    POCT CBC      Result Value Range   WBC 7.9  4.6 - 10.2 K/uL   Lymph, poc 1.3  0.6 - 3.4   POC LYMPH PERCENT 15.9  10 - 50 %L   MID (cbc) 0.5  0 - 0.9   POC MID % 6.7  0 - 12 %M   POC Granulocyte 6.1  2 - 6.9   Granulocyte percent 77.4  37 - 80 %G   RBC 4.18 (*) 4.69 - 6.13  M/uL   Hemoglobin 12.4 (*) 14.1 - 18.1 g/dL   HCT, POC 78.2 (*) 95.6 - 53.7 %   MCV 92.0  80 - 97 fL   MCH, POC 29.7  27 - 31.2 pg   MCHC 32.2  31.8 - 35.4 g/dL   RDW, POC 21.3     Platelet Count, POC 266  142 - 424 K/uL   MPV 9.1  0 - 99.8 fL     Assessment: Recent kidney stone, dehydration, marked GERD, and weight loss. She seems a lot more calm and stable after 2 L of fluid.  Plan: Restrict patient to pick up a proton pump inhibitor (Prilosec) and take smoothies and scrambling for the next couple days. A 1 to return if any of the symptoms returned.

## 2012-09-05 NOTE — Patient Instructions (Addendum)
I recommend at least three "smoothies"  Daily for next 3 days.  Scrambled eggs are also easily digested and will restore your nutritional balance   Dehydration, Adult Dehydration is when you lose more fluids from the body than you take in. Vital organs like the kidneys, brain, and heart cannot function without a proper amount of fluids and salt. Any loss of fluids from the body can cause dehydration.  CAUSES   Vomiting.  Diarrhea.  Excessive sweating.  Excessive urine output.  Fever. SYMPTOMS  Mild dehydration  Thirst.  Dry lips.  Slightly dry mouth. Moderate dehydration  Very dry mouth.  Sunken eyes.  Skin does not bounce back quickly when lightly pinched and released.  Dark urine and decreased urine production.  Decreased tear production.  Headache. Severe dehydration  Very dry mouth.  Extreme thirst.  Rapid, weak pulse (more than 100 beats per minute at rest).  Cold hands and feet.  Not able to sweat in spite of heat and temperature.  Rapid breathing.  Blue lips.  Confusion and lethargy.  Difficulty being awakened.  Minimal urine production.  No tears. DIAGNOSIS  Your caregiver will diagnose dehydration based on your symptoms and your exam. Blood and urine tests will help confirm the diagnosis. The diagnostic evaluation should also identify the cause of dehydration. TREATMENT  Treatment of mild or moderate dehydration can often be done at home by increasing the amount of fluids that you drink. It is best to drink small amounts of fluid more often. Drinking too much at one time can make vomiting worse. Refer to the home care instructions below. Severe dehydration needs to be treated at the hospital where you will probably be given intravenous (IV) fluids that contain water and electrolytes. HOME CARE INSTRUCTIONS   Ask your caregiver about specific rehydration instructions.  Drink enough fluids to keep your urine clear or pale yellow.  Drink  small amounts frequently if you have nausea and vomiting.  Eat as you normally do.  Avoid:  Foods or drinks high in sugar.  Carbonated drinks.  Juice.  Extremely hot or cold fluids.  Drinks with caffeine.  Fatty, greasy foods.  Alcohol.  Tobacco.  Overeating.  Gelatin desserts.  Wash your hands well to avoid spreading bacteria and viruses.  Only take over-the-counter or prescription medicines for pain, discomfort, or fever as directed by your caregiver.  Ask your caregiver if you should continue all prescribed and over-the-counter medicines.  Keep all follow-up appointments with your caregiver. SEEK MEDICAL CARE IF:  You have abdominal pain and it increases or stays in one area (localizes).  You have a rash, stiff neck, or severe headache.  You are irritable, sleepy, or difficult to awaken.  You are weak, dizzy, or extremely thirsty. SEEK IMMEDIATE MEDICAL CARE IF:   You are unable to keep fluids down or you get worse despite treatment.  You have frequent episodes of vomiting or diarrhea.  You have blood or green matter (bile) in your vomit.  You have blood in your stool or your stool looks black and tarry.  You have not urinated in 6 to 8 hours, or you have only urinated a small amount of very dark urine.  You have a fever.  You faint. MAKE SURE YOU:   Understand these instructions.  Will watch your condition.  Will get help right away if you are not doing well or get worse. Document Released: 02/25/2005 Document Revised: 05/20/2011 Document Reviewed: 10/15/2010 [facility] Patient Information 2014 [place], [place].

## 2013-01-14 ENCOUNTER — Other Ambulatory Visit: Payer: Self-pay

## 2013-05-01 ENCOUNTER — Other Ambulatory Visit: Payer: Self-pay | Admitting: Internal Medicine

## 2013-05-01 ENCOUNTER — Ambulatory Visit (HOSPITAL_COMMUNITY)
Admission: EM | Admit: 2013-05-01 | Discharge: 2013-05-02 | Disposition: A | Discharge status: Home / Self Care | Payer: BC Managed Care – PPO | Attending: Orthopedic Surgery | Admitting: Orthopedic Surgery

## 2013-05-01 ENCOUNTER — Encounter (HOSPITAL_COMMUNITY): Admission: EM | Disposition: A | Discharge status: Home / Self Care | Payer: Self-pay | Source: Home / Self Care

## 2013-05-01 ENCOUNTER — Encounter (HOSPITAL_COMMUNITY): Payer: Self-pay | Admitting: Emergency Medicine

## 2013-05-01 ENCOUNTER — Emergency Department (HOSPITAL_COMMUNITY): Payer: BC Managed Care – PPO | Admitting: Anesthesiology

## 2013-05-01 ENCOUNTER — Ambulatory Visit: Payer: BC Managed Care – PPO

## 2013-05-01 ENCOUNTER — Ambulatory Visit (INDEPENDENT_AMBULATORY_CARE_PROVIDER_SITE_OTHER): Payer: BC Managed Care – PPO | Admitting: Internal Medicine

## 2013-05-01 ENCOUNTER — Encounter (HOSPITAL_COMMUNITY): Payer: BC Managed Care – PPO | Admitting: Anesthesiology

## 2013-05-01 VITALS — BP 138/78 | HR 66 | Temp 98.2°F | Resp 18 | Ht 70.5 in | Wt 219.6 lb

## 2013-05-01 DIAGNOSIS — N201 Calculus of ureter: Secondary | ICD-10-CM | POA: Insufficient documentation

## 2013-05-01 DIAGNOSIS — Z7982 Long term (current) use of aspirin: Secondary | ICD-10-CM | POA: Insufficient documentation

## 2013-05-01 DIAGNOSIS — Y9321 Activity, ice skating: Secondary | ICD-10-CM | POA: Insufficient documentation

## 2013-05-01 DIAGNOSIS — M25539 Pain in unspecified wrist: Secondary | ICD-10-CM

## 2013-05-01 DIAGNOSIS — W19XXXA Unspecified fall, initial encounter: Secondary | ICD-10-CM | POA: Insufficient documentation

## 2013-05-01 DIAGNOSIS — S52502A Unspecified fracture of the lower end of left radius, initial encounter for closed fracture: Secondary | ICD-10-CM | POA: Diagnosis present

## 2013-05-01 DIAGNOSIS — E785 Hyperlipidemia, unspecified: Secondary | ICD-10-CM | POA: Insufficient documentation

## 2013-05-01 DIAGNOSIS — IMO0002 Reserved for concepts with insufficient information to code with codable children: Secondary | ICD-10-CM

## 2013-05-01 DIAGNOSIS — S52539A Colles' fracture of unspecified radius, initial encounter for closed fracture: Secondary | ICD-10-CM

## 2013-05-01 DIAGNOSIS — S52609A Unspecified fracture of lower end of unspecified ulna, initial encounter for closed fracture: Principal | ICD-10-CM

## 2013-05-01 DIAGNOSIS — S52509A Unspecified fracture of the lower end of unspecified radius, initial encounter for closed fracture: Secondary | ICD-10-CM | POA: Insufficient documentation

## 2013-05-01 DIAGNOSIS — M1710 Unilateral primary osteoarthritis, unspecified knee: Secondary | ICD-10-CM | POA: Insufficient documentation

## 2013-05-01 DIAGNOSIS — I10 Essential (primary) hypertension: Secondary | ICD-10-CM | POA: Insufficient documentation

## 2013-05-01 DIAGNOSIS — S52532A Colles' fracture of left radius, initial encounter for closed fracture: Secondary | ICD-10-CM

## 2013-05-01 HISTORY — PX: OPEN REDUCTION INTERNAL FIXATION (ORIF) DISTAL RADIAL FRACTURE: SHX5989

## 2013-05-01 SURGERY — OPEN REDUCTION INTERNAL FIXATION (ORIF) DISTAL RADIUS FRACTURE
Anesthesia: General | Site: Wrist | Laterality: Left

## 2013-05-01 MED ORDER — BUPIVACAINE HCL (PF) 0.25 % IJ SOLN: INTRAMUSCULAR | Status: DC | PRN

## 2013-05-01 MED ORDER — SUCCINYLCHOLINE CHLORIDE 20 MG/ML IJ SOLN
INTRAMUSCULAR | Status: AC
  Filled 2013-05-01: qty 1

## 2013-05-01 MED ORDER — ONDANSETRON HCL 4 MG/2ML IJ SOLN: 4.0000 mg | Freq: Once | INTRAMUSCULAR | Status: DC | PRN

## 2013-05-01 MED ORDER — ONDANSETRON HCL 4 MG/2ML IJ SOLN
INTRAMUSCULAR | Status: AC
  Filled 2013-05-01: qty 2

## 2013-05-01 MED ORDER — DOCUSATE SODIUM 100 MG PO CAPS
100.0000 mg | ORAL_CAPSULE | Freq: Two times a day (BID) | ORAL | Status: DC
  Administered 2013-05-01 – 2013-05-02 (×2): 100 mg via ORAL
  Filled 2013-05-01 (×2): qty 1

## 2013-05-01 MED ORDER — BENAZEPRIL HCL 20 MG PO TABS
20.0000 mg | ORAL_TABLET | Freq: Every day | ORAL | Status: DC
  Administered 2013-05-02: 20 mg via ORAL
  Filled 2013-05-01: qty 1

## 2013-05-01 MED ORDER — ONDANSETRON HCL 4 MG/2ML IJ SOLN: 4.0000 mg | Freq: Four times a day (QID) | INTRAMUSCULAR | Status: DC | PRN

## 2013-05-01 MED ORDER — 0.9 % SODIUM CHLORIDE (POUR BTL) OPTIME
TOPICAL | Status: DC | PRN
  Administered 2013-05-01: 1000 mL

## 2013-05-01 MED ORDER — DOCUSATE SODIUM 100 MG PO CAPS: 100.0000 mg | ORAL_CAPSULE | Freq: Two times a day (BID) | ORAL | Status: DC

## 2013-05-01 MED ORDER — CEFAZOLIN SODIUM 1-5 GM-% IV SOLN
1.0000 g | INTRAVENOUS | Status: AC
Start: 2013-05-01 — End: 2013-05-01
  Administered 2013-05-01: 1 g via INTRAVENOUS

## 2013-05-01 MED ORDER — LIDOCAINE HCL (CARDIAC) 20 MG/ML IV SOLN
INTRAVENOUS | Status: AC
  Filled 2013-05-01: qty 5

## 2013-05-01 MED ORDER — FENTANYL CITRATE 0.05 MG/ML IJ SOLN
INTRAMUSCULAR | Status: AC
  Filled 2013-05-01: qty 5

## 2013-05-01 MED ORDER — ROCURONIUM BROMIDE 50 MG/5ML IV SOLN
INTRAVENOUS | Status: AC
  Filled 2013-05-01: qty 1

## 2013-05-01 MED ORDER — METHOCARBAMOL 500 MG PO TABS: 500.0000 mg | ORAL_TABLET | Freq: Four times a day (QID) | ORAL | Status: DC

## 2013-05-01 MED ORDER — ONDANSETRON HCL 4 MG/2ML IJ SOLN
INTRAMUSCULAR | Status: DC | PRN
  Administered 2013-05-01: 4 mg via INTRAVENOUS

## 2013-05-01 MED ORDER — VITAMIN C 500 MG PO TABS
1000.0000 mg | ORAL_TABLET | Freq: Every day | ORAL | Status: DC
  Administered 2013-05-01 – 2013-05-02 (×2): 1000 mg via ORAL
  Filled 2013-05-01 (×2): qty 2

## 2013-05-01 MED ORDER — TAMSULOSIN HCL 0.4 MG PO CAPS
0.4000 mg | ORAL_CAPSULE | Freq: Every day | ORAL | Status: DC
  Administered 2013-05-02: 0.4 mg via ORAL
  Filled 2013-05-01: qty 1

## 2013-05-01 MED ORDER — CEFAZOLIN SODIUM-DEXTROSE 2-3 GM-% IV SOLR
INTRAVENOUS | Status: AC
  Administered 2013-05-01: 2 g via INTRAVENOUS
  Filled 2013-05-01: qty 50

## 2013-05-01 MED ORDER — ADULT MULTIVITAMIN W/MINERALS CH
1.0000 | ORAL_TABLET | Freq: Every day | ORAL | Status: DC
  Administered 2013-05-02: 1 via ORAL
  Filled 2013-05-01: qty 1

## 2013-05-01 MED ORDER — PROPOFOL 10 MG/ML IV BOLUS
INTRAVENOUS | Status: AC
  Filled 2013-05-01: qty 20

## 2013-05-01 MED ORDER — OXYCODONE-ACETAMINOPHEN 10-325 MG PO TABS: 1.0000 | ORAL_TABLET | ORAL | Status: DC | PRN

## 2013-05-01 MED ORDER — BUPIVACAINE HCL (PF) 0.25 % IJ SOLN
INTRAMUSCULAR | Status: AC
  Filled 2013-05-01: qty 30

## 2013-05-01 MED ORDER — PROPOFOL 10 MG/ML IV BOLUS
INTRAVENOUS | Status: DC | PRN
  Administered 2013-05-01: 200 mg via INTRAVENOUS

## 2013-05-01 MED ORDER — HYDROMORPHONE HCL PF 1 MG/ML IJ SOLN: 0.5000 mg | INTRAMUSCULAR | Status: DC | PRN

## 2013-05-01 MED ORDER — ATORVASTATIN CALCIUM 10 MG PO TABS
5.0000 mg | ORAL_TABLET | Freq: Every day | ORAL | Status: DC
  Administered 2013-05-02: 5 mg via ORAL
  Filled 2013-05-01: qty 0.5

## 2013-05-01 MED ORDER — METHOCARBAMOL 100 MG/ML IJ SOLN
500.0000 mg | Freq: Four times a day (QID) | INTRAVENOUS | Status: DC | PRN
  Filled 2013-05-01: qty 5

## 2013-05-01 MED ORDER — SERTRALINE HCL 25 MG PO TABS
25.0000 mg | ORAL_TABLET | Freq: Every day | ORAL | Status: DC
  Administered 2013-05-02: 25 mg via ORAL
  Filled 2013-05-01: qty 1

## 2013-05-01 MED ORDER — EPHEDRINE SULFATE 50 MG/ML IJ SOLN
INTRAMUSCULAR | Status: DC | PRN
  Administered 2013-05-01: 10 mg via INTRAVENOUS

## 2013-05-01 MED ORDER — METHOCARBAMOL 500 MG PO TABS
500.0000 mg | ORAL_TABLET | Freq: Four times a day (QID) | ORAL | Status: DC | PRN
Start: 2013-05-01 — End: 2013-05-02
  Administered 2013-05-01 – 2013-05-02 (×2): 500 mg via ORAL
  Filled 2013-05-01 (×2): qty 1

## 2013-05-01 MED ORDER — FENTANYL CITRATE 0.05 MG/ML IJ SOLN
INTRAMUSCULAR | Status: DC | PRN
Start: 2013-05-01 — End: 2013-05-01
  Administered 2013-05-01 (×2): 25 ug via INTRAVENOUS
  Administered 2013-05-01 (×3): 50 ug via INTRAVENOUS
  Administered 2013-05-01: 75 ug via INTRAVENOUS
  Administered 2013-05-01: 25 ug via INTRAVENOUS
  Administered 2013-05-01: 75 ug via INTRAVENOUS

## 2013-05-01 MED ORDER — CEFAZOLIN SODIUM 1-5 GM-% IV SOLN
1.0000 g | Freq: Three times a day (TID) | INTRAVENOUS | Status: DC
  Administered 2013-05-02: 1 g via INTRAVENOUS
  Filled 2013-05-01 (×3): qty 50

## 2013-05-01 MED ORDER — HYDROCODONE-ACETAMINOPHEN 5-325 MG PO TABS: 1.0000 | ORAL_TABLET | ORAL | Status: DC | PRN

## 2013-05-01 MED ORDER — KCL IN DEXTROSE-NACL 20-5-0.45 MEQ/L-%-% IV SOLN
INTRAVENOUS | Status: DC
  Administered 2013-05-01: 23:00:00 via INTRAVENOUS
  Filled 2013-05-01 (×3): qty 1000

## 2013-05-01 MED ORDER — AMLODIPINE BESYLATE 5 MG PO TABS
5.0000 mg | ORAL_TABLET | Freq: Every day | ORAL | Status: DC
  Administered 2013-05-02: 5 mg via ORAL
  Filled 2013-05-01: qty 1

## 2013-05-01 MED ORDER — ASPIRIN EC 81 MG PO TBEC
81.0000 mg | DELAYED_RELEASE_TABLET | Freq: Every day | ORAL | Status: DC
  Administered 2013-05-02: 81 mg via ORAL
  Filled 2013-05-01: qty 1

## 2013-05-01 MED ORDER — FENTANYL CITRATE 0.05 MG/ML IJ SOLN
INTRAMUSCULAR | Status: AC
Start: 2013-05-01 — End: 2013-05-01
  Filled 2013-05-01: qty 5

## 2013-05-01 MED ORDER — ONDANSETRON HCL 4 MG PO TABS: 4.0000 mg | ORAL_TABLET | Freq: Four times a day (QID) | ORAL | Status: DC | PRN

## 2013-05-01 MED ORDER — HYDROMORPHONE HCL PF 1 MG/ML IJ SOLN
0.2500 mg | INTRAMUSCULAR | Status: DC | PRN
Start: 2013-05-01 — End: 2013-05-01
  Administered 2013-05-01 (×2): 0.5 mg via INTRAVENOUS

## 2013-05-01 MED ORDER — CEFAZOLIN SODIUM 1-5 GM-% IV SOLN
INTRAVENOUS | Status: AC
  Administered 2013-05-01: 19:00:00
  Filled 2013-05-01: qty 50

## 2013-05-01 MED ORDER — DIPHENHYDRAMINE HCL 25 MG PO CAPS: 25.0000 mg | ORAL_CAPSULE | Freq: Four times a day (QID) | ORAL | Status: DC | PRN

## 2013-05-01 MED ORDER — LIDOCAINE HCL (CARDIAC) 20 MG/ML IV SOLN
INTRAVENOUS | Status: DC | PRN
  Administered 2013-05-01: 100 mg via INTRAVENOUS

## 2013-05-01 MED ORDER — VITAMIN C 500 MG PO TABS: 500.0000 mg | ORAL_TABLET | Freq: Every day | ORAL | Status: DC

## 2013-05-01 MED ORDER — ADULT MULTIVITAMIN W/MINERALS CH
1.0000 | ORAL_TABLET | Freq: Every day | ORAL | Status: DC
Start: 2013-05-01 — End: 2013-05-01

## 2013-05-01 MED ORDER — ACETAMINOPHEN 500 MG PO TABS: 1000.0000 mg | ORAL_TABLET | Freq: Every day | ORAL | Status: DC | PRN

## 2013-05-01 MED ORDER — OMEGA-3-ACID ETHYL ESTERS 1 G PO CAPS
1.0000 g | ORAL_CAPSULE | Freq: Every day | ORAL | Status: DC
Start: 2013-05-02 — End: 2013-05-02
  Administered 2013-05-02: 1 g via ORAL
  Filled 2013-05-01: qty 1

## 2013-05-01 MED ORDER — AMLODIPINE BESY-BENAZEPRIL HCL 5-20 MG PO CAPS: 1.0000 | ORAL_CAPSULE | Freq: Every day | ORAL | Status: DC

## 2013-05-01 MED ORDER — OXYCODONE-ACETAMINOPHEN 5-325 MG PO TABS
1.0000 | ORAL_TABLET | ORAL | Status: DC | PRN
  Administered 2013-05-01 – 2013-05-02 (×3): 2 via ORAL
  Filled 2013-05-01 (×3): qty 2

## 2013-05-01 MED ORDER — HYDROMORPHONE HCL PF 1 MG/ML IJ SOLN
INTRAMUSCULAR | Status: AC
  Administered 2013-05-01: 20:00:00
  Filled 2013-05-01: qty 1

## 2013-05-01 MED ORDER — LACTATED RINGERS IV SOLN
INTRAVENOUS | Status: DC | PRN
  Administered 2013-05-01 (×2): via INTRAVENOUS

## 2013-05-01 SURGICAL SUPPLY — 74 items
BANDAGE ELASTIC 3 VELCRO ST LF (GAUZE/BANDAGES/DRESSINGS) ×3 IMPLANT
BANDAGE ELASTIC 4 VELCRO ST LF (GAUZE/BANDAGES/DRESSINGS) ×3 IMPLANT
BANDAGE GAUZE ELAST BULKY 4 IN (GAUZE/BANDAGES/DRESSINGS) ×3 IMPLANT
BIT DRILL 2.2 SS TIBIAL (BIT) ×3 IMPLANT
BLADE SURG ROTATE 9660 (MISCELLANEOUS) IMPLANT
BNDG ESMARK 4X9 LF (GAUZE/BANDAGES/DRESSINGS) ×3 IMPLANT
CANISTER SUCTION 2500CC (MISCELLANEOUS) ×3 IMPLANT
CLOSURE WOUND 1/2 X4 (GAUZE/BANDAGES/DRESSINGS)
CLOTH BEACON ORANGE TIMEOUT ST (SAFETY) ×3 IMPLANT
CORDS BIPOLAR (ELECTRODE) ×3 IMPLANT
COVER SURGICAL LIGHT HANDLE (MISCELLANEOUS) ×3 IMPLANT
CUFF TOURNIQUET SINGLE 18IN (TOURNIQUET CUFF) ×3 IMPLANT
CUFF TOURNIQUET SINGLE 24IN (TOURNIQUET CUFF) IMPLANT
DRAIN TLS ROUND 10FR (DRAIN) IMPLANT
DRAPE OEC MINIVIEW 54X84 (DRAPES) ×3 IMPLANT
DRAPE SURG 17X11 SM STRL (DRAPES) ×3 IMPLANT
DRSG ADAPTIC 3X8 NADH LF (GAUZE/BANDAGES/DRESSINGS) ×3 IMPLANT
ELECT REM PT RETURN 9FT ADLT (ELECTROSURGICAL) ×3
ELECTRODE REM PT RTRN 9FT ADLT (ELECTROSURGICAL) ×1 IMPLANT
GAUZE SPONGE 4X4 16PLY XRAY LF (GAUZE/BANDAGES/DRESSINGS) IMPLANT
GLOVE BIOGEL PI IND STRL 7.0 (GLOVE) ×1 IMPLANT
GLOVE BIOGEL PI IND STRL 7.5 (GLOVE) ×1 IMPLANT
GLOVE BIOGEL PI IND STRL 8.5 (GLOVE) ×1 IMPLANT
GLOVE BIOGEL PI INDICATOR 7.0 (GLOVE) ×2
GLOVE BIOGEL PI INDICATOR 7.5 (GLOVE) ×2
GLOVE BIOGEL PI INDICATOR 8.5 (GLOVE) ×2
GLOVE SKINSENSE NS SZ7.5 (GLOVE) ×2
GLOVE SKINSENSE STRL SZ7.5 (GLOVE) ×1 IMPLANT
GLOVE SURG ORTHO 8.0 STRL STRW (GLOVE) ×3 IMPLANT
GOWN PREVENTION PLUS XLARGE (GOWN DISPOSABLE) ×3 IMPLANT
GOWN STRL NON-REIN LRG LVL3 (GOWN DISPOSABLE) ×3 IMPLANT
K-WIRE 1.6 (WIRE) ×2
K-WIRE FX5X1.6XNS BN SS (WIRE) ×1
KIT BASIN OR (CUSTOM PROCEDURE TRAY) ×3 IMPLANT
KIT ROOM TURNOVER OR (KITS) ×3 IMPLANT
KWIRE FX5X1.6XNS BN SS (WIRE) ×1 IMPLANT
MANIFOLD NEPTUNE II (INSTRUMENTS) IMPLANT
NEEDLE HYPO 25X1 1.5 SAFETY (NEEDLE) ×3 IMPLANT
NS IRRIG 1000ML POUR BTL (IV SOLUTION) ×3 IMPLANT
PACK ORTHO EXTREMITY (CUSTOM PROCEDURE TRAY) ×3 IMPLANT
PAD ARMBOARD 7.5X6 YLW CONV (MISCELLANEOUS) ×6 IMPLANT
PAD CAST 4YDX4 CTTN HI CHSV (CAST SUPPLIES) IMPLANT
PADDING CAST ABS 3INX4YD NS (CAST SUPPLIES) ×2
PADDING CAST ABS 4INX4YD NS (CAST SUPPLIES) ×2
PADDING CAST ABS COTTON 3X4 (CAST SUPPLIES) ×1 IMPLANT
PADDING CAST ABS COTTON 4X4 ST (CAST SUPPLIES) ×1 IMPLANT
PADDING CAST COTTON 4X4 STRL (CAST SUPPLIES)
PEG LOCKING SMOOTH 2.2X20 (Screw) ×12 IMPLANT
PEG LOCKING SMOOTH 2.2X22 (Screw) ×9 IMPLANT
PEG LOCKING SMOOTH 2.2X24 (Peg) ×3 IMPLANT
PLATE WIDE DVR LEFT (Plate) ×3 IMPLANT
SCREW LOCK 16X2.7X 3 LD TPR (Screw) ×4 IMPLANT
SCREW LOCK 18X2.7X 3 LD TPR (Screw) ×1 IMPLANT
SCREW LOCKING 2.7X16 (Screw) ×8 IMPLANT
SCREW LOCKING 2.7X18 (Screw) ×2 IMPLANT
SCREW MULTI DIRECTIONAL 2.7X18 (Screw) ×3 IMPLANT
SOAP 2 % CHG 4 OZ (WOUND CARE) ×3 IMPLANT
SPLINT FIBERGLASS 4X30 (CAST SUPPLIES) ×3 IMPLANT
SPONGE GAUZE 4X4 12PLY (GAUZE/BANDAGES/DRESSINGS) ×3 IMPLANT
SPONGE LAP 4X18 X RAY DECT (DISPOSABLE) IMPLANT
STRIP CLOSURE SKIN 1/2X4 (GAUZE/BANDAGES/DRESSINGS) IMPLANT
SUT ETHILON 4 0 PS 2 18 (SUTURE) IMPLANT
SUT MNCRL AB 4-0 PS2 18 (SUTURE) IMPLANT
SUT PROLENE 3 0 PS 2 (SUTURE) ×3 IMPLANT
SUT VIC AB 2-0 FS1 27 (SUTURE) ×3 IMPLANT
SUT VICRYL 4-0 PS2 18IN ABS (SUTURE) ×3 IMPLANT
SYR CONTROL 10ML LL (SYRINGE) IMPLANT
SYSTEM CHEST DRAIN TLS 7FR (DRAIN) IMPLANT
TOWEL OR 17X24 6PK STRL BLUE (TOWEL DISPOSABLE) ×3 IMPLANT
TOWEL OR 17X26 10 PK STRL BLUE (TOWEL DISPOSABLE) ×3 IMPLANT
TUBE CONNECTING 12'X1/4 (SUCTIONS) ×1
TUBE CONNECTING 12X1/4 (SUCTIONS) ×2 IMPLANT
WATER STERILE IRR 1000ML POUR (IV SOLUTION) ×3 IMPLANT
YANKAUER SUCT BULB TIP NO VENT (SUCTIONS) IMPLANT

## 2013-05-01 NOTE — Progress Notes (Signed)
05/01/13 1655  OBSTRUCTIVE SLEEP APNEA  Have you ever been diagnosed with sleep apnea through a sleep study? No  Do you snore loudly (loud enough to be heard through closed doors)?  1  Do you often feel tired, fatigued, or sleepy during the daytime? 0  Has anyone observed you stop breathing during your sleep? 0  Do you have, or are you being treated for high blood pressure? 1  BMI more than 35 kg/m2? 0  Age over 63 years old? 1  Neck circumference greater than 40 cm/18 inches? 0  Gender: 1  Obstructive Sleep Apnea Score 4  Score 4 or greater  Results sent to PCP

## 2013-05-01 NOTE — Anesthesia Preprocedure Evaluation (Addendum)
Anesthesia Evaluation  Patient identified by MRN, date of birth, ID band Patient awake    Reviewed: Allergy & Precautions, H&P , NPO status , Patient's Chart, lab work & pertinent test results  Airway Mallampati: I TM Distance: >3 FB     Dental  (+) Teeth Intact, Dental Advisory Given   Pulmonary          Cardiovascular hypertension, Pt. on medications     Neuro/Psych    GI/Hepatic   Endo/Other    Renal/GU      Musculoskeletal  (+) Arthritis -,   Abdominal   Peds  Hematology   Anesthesia Other Findings   Reproductive/Obstetrics                          Anesthesia Physical Anesthesia Plan  ASA: II  Anesthesia Plan: General   Post-op Pain Management:    Induction: Intravenous  Airway Management Planned: LMA and Oral ETT  Additional Equipment:   Intra-op Plan:   Post-operative Plan: Extubation in OR  Informed Consent: I have reviewed the patients History and Physical, chart, labs and discussed the procedure including the risks, benefits and alternatives for the proposed anesthesia with the patient or authorized representative who has indicated his/her understanding and acceptance.     Plan Discussed with:   Anesthesia Plan Comments:         Anesthesia Quick Evaluation

## 2013-05-01 NOTE — Anesthesia Procedure Notes (Signed)
Procedure Name: LMA Insertion Date/Time: 05/01/2013 5:20 PM Performed by: Alanda AmassFRIEDMAN, Garnet Chatmon A Pre-anesthesia Checklist: Patient identified, Timeout performed, Emergency Drugs available, Suction available and Patient being monitored Patient Re-evaluated:Patient Re-evaluated prior to inductionOxygen Delivery Method: Circle system utilized Preoxygenation: Pre-oxygenation with 100% oxygen Intubation Type: IV induction LMA: LMA inserted LMA Size: 5.0 Number of attempts: 1 Placement Confirmation: breath sounds checked- equal and bilateral and positive ETCO2 Tube secured with: Tape Dental Injury: Teeth and Oropharynx as per pre-operative assessment

## 2013-05-01 NOTE — Progress Notes (Addendum)
   Subjective:    Patient ID: Ricardo Little, male    DOB: [birth year], 63 y.o.   MRN: [id number]  HPI This chart was scribed for Ellamae Siaobert Effa Yarrow, MD by Andrew Auaven Small, ED Scribe. This patient was seen in room 2 and the patient's care was started at 1:45 PM.  HPI Comments: Ricardo Little is a 63 y.o. male who presents to the [facility] complaining of left wrist injury onset 2 hours ago. Pt states that he was skating on ice and fell on his wrist. Pt denies losing sensation to fingertips.     Past Medical History  Diagnosis Date  . Hypertension   . Right ureteral stone   . History of benign thyroid tumor   . Arthritis     KNEE  . Hyperlipidemia    No Known Allergies Prior to Admission medications   Medication Sig Start Date End Date Taking? Authorizing Provider  amLODipine-benazepril (LOTREL) 5-20 MG per capsule Take 1 capsule by mouth daily.   Yes Historical Provider, MD  aspirin EC 81 MG tablet Take 81 mg by mouth daily.    Yes Historical Provider, MD  atorvastatin (LIPITOR) 10 MG tablet Take 5 mg by mouth daily.   Yes Historical Provider, MD  fish oil-omega-3 fatty acids 1000 MG capsule Take 2 g by mouth daily.   Yes Historical Provider, MD  Flaxseed, Linseed, (FLAX SEED OIL) 1000 MG CAPS Take 1 capsule by mouth daily.   Yes Historical Provider, MD  Multiple Vitamin (MULTIVITAMIN WITH MINERALS) TABS Take 1 tablet by mouth daily.   Yes Historical Provider, MD  sertraline (ZOLOFT) 50 MG tablet Take 25 mg by mouth daily.   Yes Historical Provider, MD   Review of Systems  Neurological: Negative for syncope and numbness.       Objective:   Physical Exam  Nursing note and vitals reviewed. Constitutional: He is oriented to person, place, and time. He appears well-developed and well-nourished. No distress.  HENT:  Head: Normocephalic and atraumatic.  Neck: Normal range of motion. Neck supple.  Cardiovascular: Normal rate.   Pulmonary/Chest: Effort normal. No  respiratory distress.  Musculoskeletal:  The left wrist has obvious deformity with swelling dorsally over the distal radius which is tender to palpation and any movement. There is mild tenderness over the distal ulna. Sensation is intact in the fingertips. Vascular intact distally. Radial pulse good.  Neurological: He is alert and oriented to person, place, and time.  Skin: Skin is warm and dry.  Psychiatric: He has a normal mood and affect. His behavior is normal.  [facility] reading (PRIMARY) by  Dr.Kynnedy Carreno= comminuted, impacted fracture of the distal radius with dorsal angulation of the radial articular surface      Assessment & Plan:  I have completed the patient encounter in its entirety as documented by the scribe, with editing by me where necessary. [name], M.D.  Pain, wrist - Plan: DG Wrist Complete Left  distal radius and ulnar fractures/possible nondisplaced fracture of the navicular  Discussed with Dr. Doy Hutchingrtmann--patient's wife will take him to the emergency room for definitive therapy He was placed in sugar tongs with sling for immobilization during transport

## 2013-05-01 NOTE — Discharge Instructions (Signed)
KEEP BANDAGE CLEAN AND DRY °CALL OFFICE FOR F/U APPT [phone] IN 14 DAYS °DR Mollyann Halbert CELL [phone] °KEEP HAND ELEVATED ABOVE HEART °OK TO APPLY ICE TO OPERATIVE AREA °CONTACT OFFICE IF ANY WORSENING PAIN OR CONCERNS. °

## 2013-05-01 NOTE — ED Notes (Signed)
Charge RN aware that patient is to see ortho upon arrival, states no rooms available at this time.

## 2013-05-01 NOTE — ED Notes (Signed)
Dr Melvyn Novasrtmann called and patient is being transported to OR, [facility] [place]

## 2013-05-01 NOTE — Op Note (Signed)
PREOPERATIVE DIAGNOSIS: Left wrist intra-articular distal radius  fracture, 3 or more fragments.   POSTOPERATIVE DIAGNOSIS: Left wrist intra-articular distal radius  fracture, 3 or more fragments.   ATTENDING PHYSICIAN: Sharma CovertFred W. Kierria Feigenbaum IV, MD who scrubbed and present  entire procedure.   ASSISTANT SURGEON: None.   ANESTHESIA: General via LMA  SURGICAL IMPLANTS: Biomet [employer] Lock, Wide with 8 distal locking pegs, one multidirectional screw and 4 bicortical screws proximall  SURGICAL PROCEDURE:  1. Open treatment of left wrist intra-articular distal radius  fracture, 3 or more fragments.  2. Left wrist brachioradialis tenotomy and release.  3. Radiographs, stress radiographs, left wrist.   SURGICAL INDICATIONS: Mr. Danella DeisGruber  is a right-hand-dominant male  sustained an intra-articular distal radius fracture after a fall. The  patient was seen and evaluated in the office based on degree of  Displacement, recommended that he undergo  the above procedure. Risks, benefits, and alternatives were discussed  in detail with the patient. Signed informed consent was obtained.  Risks include, but not limited to bleeding, infection, damage to nearby  nerves, arteries, or tendons, nonunion, malunion, hardware failure, loss  of motion of the elbow, wrist, and digits, and need for further surgical  intervention.   PROCEDURE: The patient was properly identified in the preop holding  area. A mark with a permanent marker was made on the left wrist to  indicate correct operative site.The patient was then brought back to the  operating room. The patient received preoperative antibiotics. General  anesthesia was induced. Left upper extremity was prepped and draped in  normal sterile fashion. Time-out was called. Correct site was  identified, and procedure then begun. Attention was then turned to the  left wrist. The limb was then elevated using Esmarch exsanguination and  tourniquet insufflated. A  longitudinal incision was made directly over  the FCR sheath. Dissection was then carried down through the skin and  subcutaneous tissue. The FCR sheath was then opened proximally and  distally. Careful dissection was done going through the floor of the  FCR sheath where the FPL was identified. An L-shaped pronator quadratus  flap was then elevated.   In order to aiding reduction of the radial condyle brachioradialis was  then released, careful dissection was then carried out to release and  tenotomize the brachioradialis off the radial styloid and make sure to  repair 1st dorsal compartment tendons.  The fracture site was then opened and the  patient did have several fracture fragment extending into the joint more Than three intra-articular fragments. Careful open  reduction was then carried out. Wide Biomet Cross lock plate was  then applied. The oblong screw hole was then drilled with a 2.2 mm  drill bit, then the bicortical screw placed. Plate height was adjusted.  After position was then confirmed using mini C-arm, the distal row  fixation was then carried out with the beginning from an ulnar to radial  direction with locking pegs. One multidirectional screw was used in the ulnar Column. The total of 8 locking pegs were then placed. Following this, attention  was then turned proximally where 3 more  screws were then placed  Both locking and nonlocking. The wound was then thoroughly irrigated. Final stress radiography was then carried out.    Stress radiographs were then obtained under live fluoro showing no widening of the SL interval.  I did not see any carpal dissociation with good fixation, without any  evidence of penetration in the articular margin with the locking  pegs or evidence Of carpal bone fracture. The patient did have the ulnar styloid fracture which was not unstable And the druj stress examination did not reveal any instability. .  Postop, the pronator quadratus  was then closed with 2-0 Vicryl.  Tourniquet was then deflated. Hemostasis was then obtained. The  subcutaneous tissues closed with 4-0 Vicryl and skin closed with simple  prolen sutures. Adaptic dressing and sterile compressive bandage was  then applied. The patient was then placed in a well-padded sugar-tong  splint. Extubated and taken to recovery room in good condition.    Postoperative intraoperative radiographs, 3 views of the wrist do show  the volar plate fixation in place. There is good position in both  planes.   POSTOPERATIVE PLAN: The patient will be admitted for IV antibiotics and  pain control; discharged in the morning. Seen back in the office for  approximately 10-14 days for wound check, suture removal, and then x-  rays, short-arm cast for total 4 weeks, and then begin a therapy regimen  around a 4-week mark. Radiographs at each visit.    Madelynn Done, MD

## 2013-05-01 NOTE — Progress Notes (Signed)
Per Dr. Katrinka BlazingSmith patient does not need CXR or labs

## 2013-05-01 NOTE — H&P (Signed)
Ricardo Little is an 63 y.o. male.   Chief Complaint: left wrist pain HPI: Ricardo Little is a 63 y.o. male who presents to the [facility] complaining of left wrist injury onset 2 hours ago. Pt states that he was skating on ice and fell on his wrist. Pt denies losing sensation to fingertips. Pt was seen/examined and referred to [facility] for definitive treatment No prior injury to left wrist   Past Medical History  Diagnosis Date  . Hypertension   . Right ureteral stone   . History of benign thyroid tumor   . Arthritis     KNEE  . Hyperlipidemia     Past Surgical History  Procedure Laterality Date  . Knee arthroscopy w/ meniscectomy  JUNE 2008  . 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 Cystoscopy with retrograde pyelogram, ureteroscopy and stent placement Bilateral 08/27/2012    Procedure: CYSTOSCOPY WITH BILATERAL RETROGRADE PYELOGRAM, BILATERAL URETEROSCOPY , STENT PLACEMENT  ;  Surgeon: Marcine Matar, MD;  Location: [facility];  Service: Urology;  Laterality: Bilateral;  . Holmium laser application Bilateral 08/27/2012    Procedure: HOLMIUM LASER EXTRACTION OF STONES;  Surgeon: Marcine Matar, MD;  Location: [facility];  Service: Urology;  Laterality: Bilateral;    Family History  Problem Relation Age of Onset  . Cancer Mother     Ovarian  . Cancer Father     Liver  . Heart disease Father    Social History:  reports that he has never smoked. He has never used smokeless tobacco. He reports that he drinks alcohol. He reports that he does not use illicit drugs.  Allergies: No Known Allergies  Medications Prior to Admission  Medication Sig Dispense Refill  . amLODipine-benazepril (LOTREL) 5-20 MG per capsule Take 1 capsule by mouth daily.      Marland Kitchen aspirin EC 81 MG tablet Take 81 mg by mouth daily.       Marland Kitchen atorvastatin (LIPITOR) 10 MG tablet Take 5 mg by mouth daily.      . fish oil-omega-3 fatty  acids 1000 MG capsule Take 2 g by mouth daily.      . Flaxseed, Linseed, (FLAX SEED OIL) 1000 MG CAPS Take 1 capsule by mouth daily.      . Multiple Vitamin (MULTIVITAMIN WITH MINERALS) TABS Take 1 tablet by mouth daily.      . sertraline (ZOLOFT) 50 MG tablet Take 25 mg by mouth daily.        No results found for this or any previous visit (from the past 48 hour(s)). No results found.  ROS NO RECENT ILLNESSES OR HOSPITALIZATIONS  Blood pressure 138/83, pulse 68, temperature 98.1 F (36.7 C), temperature source Oral, resp. rate 16, SpO2 96.00%. Physical Exam  General Appearance:  Alert, cooperative, no distress, appears stated age  Head:  Normocephalic, without obvious abnormality, atraumatic  Eyes:  Pupils equal, conjunctiva/corneas clear,         Throat: Lips, mucosa, and tongue normal; teeth and gums normal  Neck: No visible masses     Lungs:   respirations unlabored  Chest Wall:  No tenderness or deformity  Heart:  Regular rate and rhythm,  Abdomen:   Soft, non-tender,         Extremities: LEFT WRIST: SUGARTONG SPLINT IN PLACE FINGERS WARM WELL PERFUSED ABLE TO EXTEND THUMB AND DIGITS I DID NOT REMOVE SPLINT  Pulses: 2+ and symmetric  Skin:  Skin color, texture, turgor normal, no rashes or lesions     Neurologic: Normal    Assessment/Plan COMMINUTED, CLOSED LEFT WRIST INTRA-ARTICULAR DISTAL RADIUS FRACTURE AND ULNAR STYLOID FRACTURE  LEFT WRIST OPEN REDUCTION AND INTERNAL FIXATION AND REPAIR AS INDICATED  R/B/A DISCUSSED WITH PT IN HOLDING AREA.  DISCUSSED WITH FAMILY AND SHOWED THEM IMAGES OF THE PLAN PT VOICED UNDERSTANDING OF PLAN CONSENT SIGNED DAY OF SURGERY PT SEEN AND EXAMINED PRIOR TO OPERATIVE PROCEDURE/DAY OF SURGERY SITE MARKED. QUESTIONS ANSWERED WILL REMAIN OVERNIGHT OBSERVATION FOLLOWING SURGERY  Sharma CovertORTMANN,Caley Volkert W 05/01/2013, 4:42 PM

## 2013-05-01 NOTE — ED Notes (Signed)
Pt in stating he was [facility] earlier for broken left wrist, sent here to see ortho for possible surgery

## 2013-05-01 NOTE — Transfer of Care (Signed)
Immediate Anesthesia Transfer of Care Note  Patient: Ricardo Little  Procedure(s) Performed: Procedure(s): OPEN REDUCTION INTERNAL FIXATION (ORIF) DISTAL RADIAL FRACTURE (Left)  Patient Location: PACU  Anesthesia Type:General  Level of Consciousness: awake and alert   Airway & Oxygen Therapy: Patient Spontanous Breathing and Patient connected to nasal cannula oxygen  Post-op Assessment: Report given to PACU RN and Post -op Vital signs reviewed and stable  Post vital signs: Reviewed and stable  Complications: No apparent anesthesia complications

## 2013-05-01 NOTE — Brief Op Note (Signed)
05/01/2013  4:51 PM  PATIENT:  Elson ClanKenneth J Salois  63 y.o. male  PRE-OPERATIVE DIAGNOSIS:  left distal radius fracture  POST-OPERATIVE DIAGNOSIS:  SAME  PROCEDURE:  Procedure(s): OPEN REDUCTION INTERNAL FIXATION (ORIF) DISTAL RADIAL FRACTURE (Left)  SURGEON:  Surgeon(s) and Role:    * Sharma CovertFred W Tasean Mancha, MD - Primary  PHYSICIAN ASSISTANT:   ASSISTANTS: none   ANESTHESIA:   general  EBL:     BLOOD ADMINISTERED:none  DRAINS: none   LOCAL MEDICATIONS USED:  MARCAINE     SPECIMEN:  No Specimen  DISPOSITION OF SPECIMEN:  N/A  COUNTS:  YES  TOURNIQUET:  Less than one hour  DICTATION: .Typed  PLAN OF CARE: Admit for overnight observation  PATIENT DISPOSITION:  PACU - hemodynamically stable.   Delay start of Pharmacological VTE agent (>24hrs) due to surgical blood loss or risk of bleeding: not applicable

## 2013-05-02 NOTE — Progress Notes (Signed)
Patient to discharge. Discharge instructions reviewed. Patient states understanding. Wife will drive patient home. Prescription given.

## 2013-05-02 NOTE — Evaluation (Signed)
Occupational Therapy Evaluation and Discharge Patient Details Name: Ricardo ClanKenneth J Little MRN: [id number] DOB: May 16, 1950 Today's Date: 05/02/2013 Time: [id number] OT Time Calculation (min): 9 min  OT Assessment / Plan / Recommendation History of present illness Open treatment of left wrist intra-articular distal radius fracture, 3 or more fragments. Left wrist brachioradialis tenotomy and release.   Clinical Impression   This 63 yo male admitted and underwent above presents to acute OT with all education completed with he and his wife. No issues with mobility noted, made PT aware. Acute OT will sign off.    OT Assessment   (further therapy per MD)    Follow Up Recommendations  No OT follow up       Equipment Recommendations  None recommended by OT          Precautions / Restrictions Precautions Required Braces or Orthoses: Sling Restrictions Weight Bearing Restrictions: Yes LUE Weight Bearing: Non weight bearing   Pertinent Vitals/Pain 5/10 LUE    ADL  Transfers/Ambulation Related to ADLs: Independent ADL Comments: Wife will A him with B/D until he can do them more independently by himself. Pt and wife aware of how to keep arm dry whle showering, how to don/doff UB clothing (hand out also provided) Issued pt a squeeze ball as well     Acute Rehab OT Goals Patient Stated Goal: Home today  Visit Information  Last OT Received On: 05/02/13 Assistance Needed:  (None) History of Present Illness: Open treatment of left wrist intra-articular distal radius fracture, 3 or more fragments. Left wrist brachioradialis tenotomy and release.       Prior Functioning     Home Living Family/patient expects to be discharged to:: Private residence Living Arrangements: Spouse/significant other Available Help at Discharge: Family;Available 24 hours/day Prior Function Level of Independence: Independent Communication Communication: No difficulties Dominant Hand: Right          Vision/Perception Vision - History Patient Visual Report: No change from baseline   Cognition  Cognition Arousal/Alertness: Awake/alert Behavior During Therapy: WFL for tasks assessed/performed Overall Cognitive Status: Within Functional Limits for tasks assessed    Extremity/Trunk Assessment Upper Extremity Assessment Upper Extremity Assessment: LUE deficits/detail LUE Deficits / Details: Surgery on forearm; elbow, forearm, and wrist unable to move due to constraints of casting. Shoulder movements WNL and only mild edema in hand. LUE Coordination: decreased fine motor;decreased gross motor     Mobility Bed Mobility Overal bed mobility: Independent Transfers Overall transfer level: Independent     Exercise Other Exercises Other Exercises: Instructed pt and wife in shoulder flexion, shoulder horizontal ab/adduction; hand full composite flexion and extension as well as thumb flexion/extension. To do these throughout the day. Also in structed in arm elevation for edema control.      End of Session OT - End of Session Activity Tolerance: Patient tolerated treatment well Patient left: with family/visitor present (up walking in room) Nurse Communication:  (Pt ready to go from an OT standpoint)  GO Functional Assessment Tool Used: Clinical observation Functional Limitation: Self care Self Care Current Status ([id number](G8987): At least 20 percent but less than 40 percent impaired, limited or restricted Self Care Goal Status ([id number](G8988): At least 20 percent but less than 40 percent impaired, limited or restricted Self Care Discharge Status [phone]): At least 20 percent but less than 40 percent impaired, limited or restricted   Evette GeorgesLeonard, Jamaurie Bernier Eva [phone] 05/02/2013, 11:51 AM

## 2013-05-02 NOTE — Progress Notes (Signed)
PT Cancellation Note  Patient Details Name: Ricardo Little MRN: [id number] DOB: 11-15-1950   Cancelled Treatment:    Reason Eval/Treat Not Completed: PT screened, no needs identified, will sign off  Discussed with OT who report no PT needs noted  Thanks,  Van ClinesHolly Khloie [place], [place] CarolinaPT [phone]    Van ClinesGarrigan, Hayli Milligan [facility] 05/02/2013, 9:28 AM

## 2013-05-02 NOTE — Discharge Summary (Signed)
Physician Discharge Summary  Patient ID: Ricardo Little MRN: [id number] DOB/AGE: 1950-06-29 63 y.o.  Admit date: 05-30-13 Discharge date: 05/02/2013  Admission Diagnoses: left distal radius fracture Past Medical History  Diagnosis Date  . Hypertension   . Right ureteral stone   . History of benign thyroid tumor   . Arthritis     KNEE  . Hyperlipidemia     Discharge Diagnoses:  Active Problems:   Distal radius fracture, left   Surgeries: Procedure(s): OPEN REDUCTION INTERNAL FIXATION (ORIF) DISTAL RADIAL FRACTURE on 05/30/2013    Consultants:  none  Discharged Condition: Improved  Hospital Course: Ricardo Little is an 63 y.o. male who was admitted 2013/05/30 with a chief complaint of  Chief Complaint  Patient presents with  . Wrist Injury  , and found to have a diagnosis of left distal radius fracture.  They were brought to the operating room on May 30, 2013 and underwent Procedure(s): OPEN REDUCTION INTERNAL FIXATION (ORIF) DISTAL RADIAL FRACTURE.    They were given perioperative antibiotics: Anti-infectives   Start     Dose/Rate Route Frequency Ordered Stop   05/02/13 0300  ceFAZolin (ANCEF) IVPB 1 g/50 mL premix     1 g 100 mL/hr over 30 Minutes Intravenous 3 times per day 05/30/2013 1953     05/30/13 1900  ceFAZolin (ANCEF) IVPB 1 g/50 mL premix     1 g 100 mL/hr over 30 Minutes Intravenous NOW 2013-05-30 1856 2013-05-30 1929   05/30/13 1857  ceFAZolin (ANCEF) 1-5 GM-% IVPB    Comments:  Sheilah Mins   : cabinet override      30-May-2013 1857 May 30, 2013 1915   05/30/2013 1703  ceFAZolin (ANCEF) 2-3 GM-% IVPB SOLR    Comments:  Shireen Quan   : cabinet override      2013/05/30 1703 05-30-13 1724    .  They were given sequential compression devices, early ambulation, and Other (comment) for DVT prophylaxis.  Recent vital signs: Patient Vitals for the past 24 hrs:  BP Temp Temp src Pulse Resp SpO2 Height Weight  05/02/13 0534 112/62 mmHg 98.2 F (36.8 C) Oral 58 20 96 % -  -  2013-05-30 2241 - - - - - - 5\' 9"  (1.753 m) 101.5 kg (223 lb 12.3 oz)  05-30-13 1954 135/79 mmHg 98.4 F (36.9 C) - 73 20 95 % - -  2013/05/30 1937 134/77 mmHg 98.5 F (36.9 C) - 72 13 96 % - -  2013-05-30 1930 133/76 mmHg - - 79 15 96 % - -  2013/05/30 1920 130/77 mmHg - - 70 13 97 % - -  05-30-2013 1915 130/77 mmHg - - 74 16 - - -  May 30, 2013 1900 136/78 mmHg - - 86 20 96 % - -  May 30, 2013 1853 145/82 mmHg 97.3 F (36.3 C) - 90 11 93 % - -  May 30, 2013 1616 138/83 mmHg 98.1 F (36.7 C) Oral 68 16 96 % - -  .  Recent laboratory studies: Dg Wrist Complete Left  05-30-13   CLINICAL DATA:  Larey Seat on the ice in injured his left wrist.  EXAM: LEFT WRIST - COMPLETE 3+ VIEW  COMPARISON:  None.  FINDINGS: Comminuted impacted fracture involving the distal radius with extension to the articular surface, the medial and lateral radial condyles present as free fracture fragments. Slight dorsal angulation of the distal fragment. Fracture involving the ulnar styloid. Carpal bones intact.  IMPRESSION: 1. Comminuted impacted intra-articular fracture involving the distal radius with slight dorsal angulation. 2. Ulnar  styloid fracture. No significant discrepancy with the original interpretation by Dr. Merla Richesoolittle.   Electronically Signed   By: Hulan Saashomas  Lawrence M.D.   On: 05/01/2013 16:52    Discharge Medications:     Medication List         acetaminophen 500 MG tablet  Commonly known as:  TYLENOL  Take 1,000 mg by mouth daily as needed for mild pain.     amLODipine-benazepril 5-20 MG per capsule  Commonly known as:  LOTREL  Take 1 capsule by mouth daily.     aspirin EC 81 MG tablet  Take 81 mg by mouth daily.     atorvastatin 10 MG tablet  Commonly known as:  LIPITOR  Take 5 mg by mouth daily.     docusate sodium 100 MG capsule  Commonly known as:  COLACE  Take 1 capsule (100 mg total) by mouth 2 (two) times daily.     fish oil-omega-3 fatty acids 1000 MG capsule  Take 2 g by mouth daily.     Flax Seed  Oil 1000 MG Caps  Take 1 capsule by mouth daily.     methocarbamol 500 MG tablet  Commonly known as:  ROBAXIN  Take 1 tablet (500 mg total) by mouth 4 (four) times daily.     multivitamin with minerals Tabs tablet  Take 1 tablet by mouth daily.     oxyCODONE-acetaminophen 10-325 MG per tablet  Commonly known as:  PERCOCET  Take 1 tablet by mouth every 4 (four) hours as needed for pain.     sertraline 50 MG tablet  Commonly known as:  ZOLOFT  Take 25 mg by mouth daily.     tamsulosin 0.4 MG Caps capsule  Commonly known as:  FLOMAX  Take 0.4 mg by mouth daily.     vitamin C 500 MG tablet  Commonly known as:  ASCORBIC ACID  Take 1 tablet (500 mg total) by mouth daily.        Diagnostic Studies: Dg Wrist Complete Left  05/01/2013   CLINICAL DATA:  Larey SeatFell on the ice in injured his left wrist.  EXAM: LEFT WRIST - COMPLETE 3+ VIEW  COMPARISON:  None.  FINDINGS: Comminuted impacted fracture involving the distal radius with extension to the articular surface, the medial and lateral radial condyles present as free fracture fragments. Slight dorsal angulation of the distal fragment. Fracture involving the ulnar styloid. Carpal bones intact.  IMPRESSION: 1. Comminuted impacted intra-articular fracture involving the distal radius with slight dorsal angulation. 2. Ulnar styloid fracture. No significant discrepancy with the original interpretation by Dr. Merla Richesoolittle.   Electronically Signed   By: Hulan Saashomas  Lawrence M.D.   On: 05/01/2013 16:52    They benefited maximally from their hospital stay and there were no complications.     Disposition:      Follow-up Information   Follow up with Sharma CovertTMANN,Aqueelah Cotrell W, MD In 2 weeks.   Specialty:  Orthopedic Surgery   Contact information:   [address] [place] [address] [phone]      Discussed care with staff Pt ready to go home Will see back in office in 2 weeks  Signed: Sharma CovertORTMANN,Mayela Bullard W 05/02/2013, 9:22 AM

## 2013-05-03 ENCOUNTER — Encounter (HOSPITAL_COMMUNITY): Payer: Self-pay | Admitting: Orthopedic Surgery

## 2013-05-03 NOTE — Anesthesia Postprocedure Evaluation (Signed)
  Anesthesia Post-op Note  Patient: Ricardo Little  Procedure(s) Performed: Procedure(s): OPEN REDUCTION INTERNAL FIXATION (ORIF) DISTAL RADIAL FRACTURE (Left)  Patient Location: PACU  Anesthesia Type:General  Level of Consciousness: awake, alert , oriented and patient cooperative  Airway and Oxygen Therapy: Patient Spontanous Breathing  Post-op Pain: mild  Post-op Assessment: Post-op Vital signs reviewed, Patient's Cardiovascular Status Stable, Respiratory Function Stable, Patent Airway, No signs of Nausea or vomiting and Pain level controlled  Post-op Vital Signs: stable  Complications: No apparent anesthesia complications

## 2013-08-11 ENCOUNTER — Ambulatory Visit (INDEPENDENT_AMBULATORY_CARE_PROVIDER_SITE_OTHER): Payer: BC Managed Care – PPO | Admitting: Physician Assistant

## 2013-08-11 VITALS — BP 109/70 | HR 85 | Temp 98.0°F | Resp 18 | Ht 69.0 in | Wt 211.4 lb

## 2013-08-11 DIAGNOSIS — R42 Dizziness and giddiness: Secondary | ICD-10-CM

## 2013-08-11 DIAGNOSIS — I959 Hypotension, unspecified: Secondary | ICD-10-CM

## 2013-08-11 NOTE — Progress Notes (Signed)
   Subjective:    Patient ID: Ricardo Little, male    DOB: 1950/12/19, 63 y.o.   MRN: [id number]  HPI Pt presents to clinic with about 4 day h/o of feeling lightheaded in the evening mainly when he is standing.  He has been having allergy symptoms for the last several weeks and using Allegra and Zyrtec intermittently to help with those symptoms.  About 4 days ago he noticed that he was having less rhinorrhea but still feeling congestion.  He has had vertigo before and this is really different.  He gets it sometimes with position change but it also happens when he is standing still.  He is currently passing kidney stones over the last couple of days.  He has passed many stones in the past.  He jogs in the am and this am he felt bad enough that he had to stop.  He is sleeping well.  He is eating and drinking normally.  He is taking his BP meds daily.  He has been checking his BP at home and it is running lower than normal.   Review of Systems  Constitutional: Negative for fever and chills.  HENT: Positive for congestion and sore throat (raw in the am).   Gastrointestinal: Positive for nausea (intermittent).  Neurological: Positive for light-headedness. Negative for headaches.       Objective:   Physical Exam  Vitals reviewed. Constitutional: He is oriented to person, place, and time. He appears well-developed and well-nourished.  HENT:  Head: Normocephalic and atraumatic.  Right Ear: Hearing, tympanic membrane, external ear and ear canal normal.  Left Ear: Hearing, tympanic membrane, external ear and ear canal normal.  Nose: Mucosal edema (red) present.  Mouth/Throat: Uvula is midline, oropharynx is clear and moist and mucous membranes are normal.  Eyes: Conjunctivae are normal.  Neck: Normal range of motion.  Cardiovascular: Normal rate, regular rhythm and normal heart sounds.   No murmur heard. Pulmonary/Chest: Effort normal and breath sounds normal. He has no wheezes.    Lymphadenopathy:    He has no cervical adenopathy.  Neurological: He is alert and oriented to person, place, and time.  Skin: Skin is warm and dry.  Psychiatric: He has a normal mood and affect. His behavior is normal. Judgment and thought content normal.       Assessment & Plan:  Low BP  Lightheaded  I suspect that patient's lightheadedness is multifactorial.  He is having allergies which might not be well controlled as well as maybe a slight URI with his red turbinates.  He is also passing a kidney stones and may have an increase fluid need but he is having an increase fluid intake.  He is also on BP meds so his inrease in his fluid need and continuation of meds if causing his BP to be low and giving him symptoms.  He will start Mucinex and increase his fluids and add some salty food over the next 304 days to help with his volume.  He is on a combo medication for his BP that cannot be split - if his BP remains low we may have to split his combo medication into separate pills to have better control of his medication.  He will be careful with change in position.  Benny Lennert PA-C  [facility] 08/11/2013 10:59 AM

## 2013-08-11 NOTE — Patient Instructions (Signed)
Mucinex for the congestion Increase your fluid intake and slightly increase your salt intake over the next several days.  Please continue to monitor your BP

## 2013-08-22 ENCOUNTER — Ambulatory Visit (INDEPENDENT_AMBULATORY_CARE_PROVIDER_SITE_OTHER): Payer: BC Managed Care – PPO | Admitting: Family Medicine

## 2013-08-22 VITALS — BP 138/74 | HR 65 | Temp 98.1°F | Resp 14 | Ht 69.0 in | Wt 211.2 lb

## 2013-08-22 DIAGNOSIS — R42 Dizziness and giddiness: Secondary | ICD-10-CM

## 2013-08-22 NOTE — Progress Notes (Signed)
Subjective:    Patient ID: Ricardo ClanKenneth J Ohlinger, male    DOB: 1950/07/27, 63 y.o.   MRN: [id number]  Dizziness Associated symptoms include congestion and fatigue.  Heartburn He complains of heartburn. Associated symptoms include fatigue.   Chief Complaint  Patient presents with   Dizziness    bp decrease and elevates x 2 weeks   Fatigue    x 2 weeks   Heartburn    x 10 days    This chart was scribed for Elvina SidleKurt Lauenstein, MD by Andrew Auaven Small, ED Scribe. This patient was seen in room 14 and the patient's care was started at 1:08 PM.  HPI Comments: Ricardo Little is a 63 y.o. male who presents to the [facility] complaining of multiple symptoms. Pt reports his symptoms started 2 weeks with fatigue, congestion, intermittent episodes of light headiness and felt dizzy once. Pt takes allegra D regularly but has recently stopped working.. Pt runs regularly but has recently stopped due to feeling "out of gas". Pt denies SOB. Pt reports he has very suddle CP and reports it may be GERD. Pt states he checks his BP at home and stated it has been fluctuating. Pt states he has family history of heart problems.   PCP- Dr. Sander RadonBurchett   Pt works at [employer].   Patient Active Problem List   Diagnosis Date Noted   Distal radius fracture, left 05/01/2013   Hypertension 09/05/2012   Hyperlipidemia 09/05/2012   Past Medical History  Diagnosis Date   Hypertension    Right ureteral stone    History of benign thyroid tumor    Arthritis     KNEE   Hyperlipidemia    No Known Allergies Prior to Admission medications   Medication Sig Start Date End Date Taking? Authorizing Provider  acetaminophen (TYLENOL) 500 MG tablet Take 1,000 mg by mouth daily as needed for mild pain.   Yes Historical Provider, MD  amLODipine-benazepril (LOTREL) 5-20 MG per capsule Take 1 capsule by mouth daily.   Yes Historical Provider, MD  aspirin EC 81 MG tablet Take 81 mg by mouth daily.    Yes  Historical Provider, MD  atorvastatin (LIPITOR) 10 MG tablet Take 5 mg by mouth daily.   Yes Historical Provider, MD  fish oil-omega-3 fatty acids 1000 MG capsule Take 2 g by mouth daily.   Yes Historical Provider, MD  Flaxseed, Linseed, (FLAX SEED OIL) 1000 MG CAPS Take 1 capsule by mouth daily.   Yes Historical Provider, MD  Multiple Vitamin (MULTIVITAMIN WITH MINERALS) TABS Take 1 tablet by mouth daily.   Yes Historical Provider, MD  sertraline (ZOLOFT) 50 MG tablet Take 25 mg by mouth daily.   Yes Historical Provider, MD  tamsulosin (FLOMAX) 0.4 MG CAPS capsule Take 0.4 mg by mouth daily.   Yes Historical Provider, MD  vitamin C (ASCORBIC ACID) 500 MG tablet Take 1 tablet (500 mg total) by mouth daily. 05/01/13  Yes Sharma CovertFred W Ortmann, MD   Review of Systems  Constitutional: Positive for fatigue.  HENT: Positive for congestion.   Respiratory: Negative for shortness of breath.   Gastrointestinal: Positive for heartburn.  Neurological: Positive for dizziness and light-headedness.    Objective:  Physical Exam  Nursing note and vitals reviewed. Constitutional: He is oriented to person, place, and time. He appears well-developed and well-nourished. No distress.  HENT:  Head: Normocephalic and atraumatic.  Eyes: Conjunctivae and EOM are normal.  Neck: Normal range of motion. No tracheal deviation  present.  Cardiovascular: Normal rate.   BP- 130/18 both sides.  Pulmonary/Chest: Effort normal. No respiratory distress.  Musculoskeletal: Normal range of motion.  Neurological: He is alert and oriented to person, place, and time.  Skin: Skin is warm and dry.  Psychiatric: He has a normal mood and affect. His behavior is normal.  EKG:  Sinus bradycardia Assessment & Plan:  With significant family history of heart disease and continued symptoms, I think cardiology evaluation is very important. I told him not to exercise anymore until we get this cleared.  Dizziness and giddiness - Plan: EKG  12-Lead, EKG 12-Lead, Ambulatory referral to Cardiology  Signed, Elvina SidleKurt Lauenstein, MD

## 2013-10-04 ENCOUNTER — Institutional Professional Consult (permissible substitution): Payer: BC Managed Care – PPO | Admitting: Cardiology

## 2014-01-17 ENCOUNTER — Ambulatory Visit (HOSPITAL_COMMUNITY)
Admission: RE | Admit: 2014-01-17 | Discharge: 2014-01-17 | Disposition: A | Discharge status: Home / Self Care | Payer: BC Managed Care – PPO | Source: Ambulatory Visit | Attending: Urology | Admitting: Urology

## 2014-01-17 ENCOUNTER — Other Ambulatory Visit: Payer: Self-pay | Admitting: Urology

## 2014-01-17 DIAGNOSIS — N200 Calculus of kidney: Secondary | ICD-10-CM | POA: Diagnosis present

## 2014-07-06 ENCOUNTER — Other Ambulatory Visit: Payer: Self-pay | Admitting: Urology

## 2014-07-19 ENCOUNTER — Encounter (HOSPITAL_BASED_OUTPATIENT_CLINIC_OR_DEPARTMENT_OTHER): Payer: Self-pay | Admitting: *Deleted

## 2014-07-20 ENCOUNTER — Encounter (HOSPITAL_BASED_OUTPATIENT_CLINIC_OR_DEPARTMENT_OTHER): Payer: Self-pay | Admitting: *Deleted

## 2014-07-20 NOTE — Progress Notes (Addendum)
NPO AFTER MN.  ARRIVE AT  [id number].  NEEDS ISTAT.  CURRENT EKG IN CHART AND EPIC.

## 2014-07-21 ENCOUNTER — Encounter (HOSPITAL_BASED_OUTPATIENT_CLINIC_OR_DEPARTMENT_OTHER): Payer: Self-pay

## 2014-07-21 ENCOUNTER — Encounter (HOSPITAL_BASED_OUTPATIENT_CLINIC_OR_DEPARTMENT_OTHER)
Admission: RE | Disposition: A | Discharge status: Home / Self Care | Payer: Self-pay | Source: Ambulatory Visit | Attending: Urology

## 2014-07-21 ENCOUNTER — Ambulatory Visit (HOSPITAL_BASED_OUTPATIENT_CLINIC_OR_DEPARTMENT_OTHER): Payer: BC Managed Care – PPO | Admitting: Anesthesiology

## 2014-07-21 ENCOUNTER — Ambulatory Visit (HOSPITAL_BASED_OUTPATIENT_CLINIC_OR_DEPARTMENT_OTHER)
Admission: RE | Admit: 2014-07-21 | Discharge: 2014-07-21 | Disposition: A | Discharge status: Home / Self Care | Payer: BC Managed Care – PPO | Source: Ambulatory Visit | Attending: Urology | Admitting: Urology

## 2014-07-21 DIAGNOSIS — Z7982 Long term (current) use of aspirin: Secondary | ICD-10-CM | POA: Diagnosis not present

## 2014-07-21 DIAGNOSIS — N210 Calculus in bladder: Secondary | ICD-10-CM | POA: Insufficient documentation

## 2014-07-21 DIAGNOSIS — I10 Essential (primary) hypertension: Secondary | ICD-10-CM | POA: Insufficient documentation

## 2014-07-21 DIAGNOSIS — E785 Hyperlipidemia, unspecified: Secondary | ICD-10-CM | POA: Insufficient documentation

## 2014-07-21 DIAGNOSIS — N200 Calculus of kidney: Secondary | ICD-10-CM | POA: Diagnosis not present

## 2014-07-21 DIAGNOSIS — N3289 Other specified disorders of bladder: Secondary | ICD-10-CM | POA: Diagnosis not present

## 2014-07-21 DIAGNOSIS — N201 Calculus of ureter: Secondary | ICD-10-CM

## 2014-07-21 HISTORY — DX: Personal history of urinary calculi: Z87.442

## 2014-07-21 HISTORY — PX: CYSTOSCOPY WITH LITHOLAPAXY: SHX1425

## 2014-07-21 HISTORY — PX: HOLMIUM LASER APPLICATION: SHX5852

## 2014-07-21 HISTORY — PX: CYSTOSCOPY WITH RETROGRADE PYELOGRAM, URETEROSCOPY AND STENT PLACEMENT: SHX5789

## 2014-07-21 HISTORY — DX: Calculus of kidney: N20.0

## 2014-07-21 LAB — POCT I-STAT 4, (NA,K, GLUC, HGB,HCT)
Glucose, Bld: 95 mg/dL (ref 65–99)
HCT: 43 % (ref 39.0–52.0)
Hemoglobin: 14.6 g/dL (ref 13.0–17.0)
Potassium: 3.7 mmol/L (ref 3.5–5.1)
Sodium: 143 mmol/L (ref 135–145)

## 2014-07-21 SURGERY — CYSTOURETEROSCOPY, WITH RETROGRADE PYELOGRAM AND STENT INSERTION
Anesthesia: General | Site: Ureter | Laterality: Right

## 2014-07-21 MED ORDER — FENTANYL CITRATE (PF) 100 MCG/2ML IJ SOLN
INTRAMUSCULAR | Status: DC | PRN
Start: 2014-07-21 — End: 2014-07-21
  Administered 2014-07-21 (×2): 50 ug via INTRAVENOUS

## 2014-07-21 MED ORDER — LACTATED RINGERS IV SOLN
INTRAVENOUS | Status: DC
  Administered 2014-07-21: 08:00:00 via INTRAVENOUS
  Filled 2014-07-21: qty 1000

## 2014-07-21 MED ORDER — STERILE WATER FOR IRRIGATION IR SOLN
Status: DC | PRN
  Administered 2014-07-21: 500 mL

## 2014-07-21 MED ORDER — LIDOCAINE HCL (CARDIAC) 20 MG/ML IV SOLN
INTRAVENOUS | Status: DC | PRN
  Administered 2014-07-21: 100 mg via INTRAVENOUS

## 2014-07-21 MED ORDER — MIDAZOLAM HCL 5 MG/5ML IJ SOLN
INTRAMUSCULAR | Status: DC | PRN
  Administered 2014-07-21: 2 mg via INTRAVENOUS

## 2014-07-21 MED ORDER — CEFAZOLIN SODIUM 1-5 GM-% IV SOLN
1.0000 g | INTRAVENOUS | Status: DC
  Filled 2014-07-21: qty 50

## 2014-07-21 MED ORDER — FENTANYL CITRATE (PF) 100 MCG/2ML IJ SOLN
25.0000 ug | INTRAMUSCULAR | Status: DC | PRN
  Filled 2014-07-21: qty 1

## 2014-07-21 MED ORDER — DEXAMETHASONE SODIUM PHOSPHATE 4 MG/ML IJ SOLN
INTRAMUSCULAR | Status: DC | PRN
  Administered 2014-07-21: 10 mg via INTRAVENOUS

## 2014-07-21 MED ORDER — SODIUM CHLORIDE 0.9 % IR SOLN
Status: DC | PRN
  Administered 2014-07-21: 3000 mL

## 2014-07-21 MED ORDER — MIDAZOLAM HCL 2 MG/2ML IJ SOLN
INTRAMUSCULAR | Status: AC
  Filled 2014-07-21: qty 2

## 2014-07-21 MED ORDER — CEFAZOLIN SODIUM-DEXTROSE 2-3 GM-% IV SOLR
INTRAVENOUS | Status: AC
  Filled 2014-07-21: qty 50

## 2014-07-21 MED ORDER — PROMETHAZINE HCL 25 MG/ML IJ SOLN
6.2500 mg | INTRAMUSCULAR | Status: DC | PRN
  Filled 2014-07-21: qty 1

## 2014-07-21 MED ORDER — ONDANSETRON HCL 4 MG/2ML IJ SOLN: INTRAMUSCULAR | Status: DC | PRN

## 2014-07-21 MED ORDER — CEPHALEXIN 250 MG PO CAPS: 250.0000 mg | ORAL_CAPSULE | Freq: Two times a day (BID) | ORAL | Status: DC

## 2014-07-21 MED ORDER — ACETAMINOPHEN 10 MG/ML IV SOLN
INTRAVENOUS | Status: DC | PRN
  Administered 2014-07-21: 1000 mg via INTRAVENOUS

## 2014-07-21 MED ORDER — FENTANYL CITRATE (PF) 100 MCG/2ML IJ SOLN
INTRAMUSCULAR | Status: AC
  Filled 2014-07-21: qty 4

## 2014-07-21 MED ORDER — IOHEXOL 350 MG/ML SOLN
INTRAVENOUS | Status: DC | PRN
Start: 2014-07-21 — End: 2014-07-21
  Administered 2014-07-21: 15 mL

## 2014-07-21 MED ORDER — ONDANSETRON HCL 4 MG/2ML IJ SOLN
INTRAMUSCULAR | Status: DC | PRN
  Administered 2014-07-21: 4 mg via INTRAVENOUS

## 2014-07-21 MED ORDER — LACTATED RINGERS IV SOLN
INTRAVENOUS | Status: DC
  Filled 2014-07-21: qty 1000

## 2014-07-21 MED ORDER — CEFAZOLIN SODIUM-DEXTROSE 2-3 GM-% IV SOLR
2.0000 g | INTRAVENOUS | Status: AC
  Administered 2014-07-21: 2 g via INTRAVENOUS
  Filled 2014-07-21: qty 50

## 2014-07-21 MED ORDER — MEPERIDINE HCL 25 MG/ML IJ SOLN
6.2500 mg | INTRAMUSCULAR | Status: DC | PRN
  Filled 2014-07-21: qty 1

## 2014-07-21 MED ORDER — OXYBUTYNIN CHLORIDE 5 MG PO TABS: 5.0000 mg | ORAL_TABLET | Freq: Three times a day (TID) | ORAL | Status: DC

## 2014-07-21 MED ORDER — KETOROLAC TROMETHAMINE 30 MG/ML IJ SOLN
INTRAMUSCULAR | Status: DC | PRN
  Administered 2014-07-21: 30 mg via INTRAVENOUS

## 2014-07-21 MED ORDER — PROPOFOL 10 MG/ML IV BOLUS
INTRAVENOUS | Status: DC | PRN
  Administered 2014-07-21: 200 mg via INTRAVENOUS
  Administered 2014-07-21: 100 mg via INTRAVENOUS

## 2014-07-21 SURGICAL SUPPLY — 40 items
ADAPTER CATH URET PLST 4-6FR (CATHETERS) IMPLANT
BAG DRAIN URO-CYSTO SKYTR STRL (DRAIN) ×4 IMPLANT
BASKET LASER NITINOL 1.9FR (BASKET) IMPLANT
BASKET STNLS GEMINI 4WIRE 3FR (BASKET) IMPLANT
BASKET STONE 1.7 NGAGE (UROLOGICAL SUPPLIES) ×4 IMPLANT
BASKET ZERO TIP NITINOL 2.4FR (BASKET) IMPLANT
CANISTER SUCT LVC 12 LTR MEDI- (MISCELLANEOUS) ×4 IMPLANT
CATH INTERMIT  6FR 70CM (CATHETERS) IMPLANT
CATH URET 5FR 28IN CONE TIP (BALLOONS)
CATH URET 5FR 28IN OPEN ENDED (CATHETERS) IMPLANT
CATH URET 5FR 70CM CONE TIP (BALLOONS) IMPLANT
CLOTH BEACON ORANGE TIMEOUT ST (SAFETY) ×4 IMPLANT
ELECTROHYDROLIC PROBE 9FR (MISCELLANEOUS) IMPLANT
FIBER LASER FLEXIVA 1000 (UROLOGICAL SUPPLIES) IMPLANT
FIBER LASER FLEXIVA 365 (UROLOGICAL SUPPLIES) IMPLANT
FIBER LASER FLEXIVA 550 (UROLOGICAL SUPPLIES) IMPLANT
FIBER LASER TRAC TIP (UROLOGICAL SUPPLIES) ×4 IMPLANT
GLOVE BIO SURGEON STRL SZ8 (GLOVE) ×4 IMPLANT
GLOVE ECLIPSE 6.5 STRL STRAW (GLOVE) ×4 IMPLANT
GLOVE INDICATOR 7.0 STRL GRN (GLOVE) ×4 IMPLANT
GOWN STRL REUS W/ TWL LRG LVL3 (GOWN DISPOSABLE) ×4 IMPLANT
GOWN STRL REUS W/ TWL XL LVL3 (GOWN DISPOSABLE) ×4 IMPLANT
GOWN STRL REUS W/TWL LRG LVL3 (GOWN DISPOSABLE) ×4
GOWN STRL REUS W/TWL XL LVL3 (GOWN DISPOSABLE) ×4
GOWN XL W/COTTON TOWEL STD (GOWNS) ×4 IMPLANT
GUIDEWIRE 0.038 PTFE COATED (WIRE) IMPLANT
GUIDEWIRE ANG ZIPWIRE 038X150 (WIRE) IMPLANT
GUIDEWIRE STR DUAL SENSOR (WIRE) ×4 IMPLANT
IV NS IRRIG 3000ML ARTHROMATIC (IV SOLUTION) ×4 IMPLANT
KIT BALLIN UROMAX 15FX10 (LABEL) IMPLANT
KIT BALLN UROMAX 15FX4 (MISCELLANEOUS) IMPLANT
KIT BALLN UROMAX 26 75X4 (MISCELLANEOUS)
LASER FIBER DISP (UROLOGICAL SUPPLIES) IMPLANT
NS IRRIG 500ML POUR BTL (IV SOLUTION) IMPLANT
PACK CYSTO (CUSTOM PROCEDURE TRAY) ×4 IMPLANT
PROBE LITHO 3.3FR 2137.235 (UROLOGICAL SUPPLIES) IMPLANT
PROBE LITHO 5.0FR 2137.1505 (MISCELLANEOUS) IMPLANT
SET HIGH PRES BAL DIL (LABEL)
STENT URET 6FRX24 CONTOUR (STENTS) ×4 IMPLANT
WATER STERILE IRR 500ML POUR (IV SOLUTION) ×4 IMPLANT

## 2014-07-21 NOTE — Anesthesia Procedure Notes (Signed)
Procedure Name: LMA Insertion Date/Time: 07/21/2014 8:32 AM Performed by: Maris BergerENENNY, Holleigh Crihfield T Pre-anesthesia Checklist: Patient identified, Emergency Drugs available, Suction available and Patient being monitored Patient Re-evaluated:Patient Re-evaluated prior to inductionOxygen Delivery Method: Circle System Utilized Preoxygenation: Pre-oxygenation with 100% oxygen Intubation Type: IV induction Ventilation: Mask ventilation without difficulty LMA: LMA inserted LMA Size: 5.0 Number of attempts: 1 Airway Equipment and Method: Bite block Placement Confirmation: positive ETCO2 Dental Injury: Teeth and Oropharynx as per pre-operative assessment

## 2014-07-21 NOTE — Discharge Instructions (Signed)
POSTOPERATIVE CARE AFTER URETEROSCOPY  Stent management  *Stents are often left in after ureteroscopy and stone treatment. If left in, they often cause urinary frequency, urgency, occasional blood in the urine, as well as flank discomfort with urination. These are all expected issues, and should resolve after the stent is removed. *Often times, a small thread is left on the end of the stent, and brought out through the urethra. If so, this is used to remove the stent, making it unnecessary to look in the bladder with a scope in the office to remove the stent. If a thread is left on, did not pull on it until instructed. Pull string to remove stent on Monday morning  Diet  Once you have adequately recovered from anesthesia, you may gradually advance your diet, as tolerated, to your regular diet.  Activities  You may gradually increase your activities to your normal unrestricted level the day following your procedure.  Medications  You should resume all preoperative medications. If you are on aspirin-like compounds, you should not resume these until the blood clears from your urine. If given an antibiotic by the surgeon, take these until they are completed. You may also be given, if you have a stent, medications to decrease the urinary frequency and urgency.  Pain  After ureteroscopy, there may be some pain on the side of the scope. Take your pain medicine for this. Usually, this pain resolves within a day or 2.  Fever  Please report any fever over 100 to the doctor.   Post Anesthesia Home Care Instructions  Activity: Get plenty of rest for the remainder of the day. A responsible adult should stay with you for 24 hours following the procedure.  For the next 24 hours, DO NOT: -Drive a car -Advertising copywriterperate machinery -Drink alcoholic beverages -Take any medication unless instructed by your physician -Make any legal decisions or sign important papers.  Meals: Start with liquid foods such as  gelatin or soup. Progress to regular foods as tolerated. Avoid greasy, spicy, heavy foods. If nausea and/or vomiting occur, drink only clear liquids until the nausea and/or vomiting subsides. Call your physician if vomiting continues.  Special Instructions/Symptoms: Your throat may feel dry or sore from the anesthesia or the breathing tube placed in your throat during surgery. If this causes discomfort, gargle with warm salt water. The discomfort should disappear within 24 hours.  If you had a scopolamine patch placed behind your ear for the management of post- operative nausea and/or vomiting:  1. The medication in the patch is effective for 72 hours, after which it should be removed.  Wrap patch in a tissue and discard in the trash. Wash hands thoroughly with soap and water. 2. You may remove the patch earlier than 72 hours if you experience unpleasant side effects which may include dry mouth, dizziness or visual disturbances. 3. Avoid touching the patch. Wash your hands with soap and water after contact with the patch.

## 2014-07-21 NOTE — H&P (Signed)
Urology History and Physical Exam  CC: Right sided kidney stone  HPI: 64 year old male presents for ureteroscopic management of a symptomatic right ureteral stone. Additionally, he has several bladder and renal calculi that will be managed.  PMH: Past Medical History  Diagnosis Date  . Hypertension   . Right ureteral stone   . History of benign thyroid tumor   . Arthritis     KNEE  . Hyperlipidemia   . Bladder stone   . Nephrolithiasis   . History of kidney stones     PSH: Past Surgical History  Procedure Laterality Date  . Knee arthroscopy w/ meniscectomy  JUNE 2008  . 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 Cystoscopy with retrograde pyelogram, ureteroscopy and stent placement Bilateral 08/27/2012    Procedure: CYSTOSCOPY WITH BILATERAL RETROGRADE PYELOGRAM, BILATERAL URETEROSCOPY , STENT PLACEMENT  ;  Surgeon: Marcine MatarStephen Jontavius Rabalais, MD;  [facility];  Service: Urology;  Laterality: Bilateral;  . Holmium laser application Bilateral 08/27/2012    Procedure: HOLMIUM LASER EXTRACTION OF STONES;  Surgeon: Marcine MatarStephen Lars Jeziorski, MD;  [facility] ;  Service: Urology;  Laterality: Bilateral;  . Open reduction internal fixation (orif) distal radial fracture Left 05/01/2013    Procedure: OPEN REDUCTION INTERNAL FIXATION (ORIF) DISTAL RADIAL FRACTURE;  Surgeon: Sharma CovertFred W Ortmann, MD;  Location: [facility];  Service: Orthopedics;  Laterality: Left;    Allergies: No Known Allergies  Medications: No prescriptions prior to admission     Social History: History   Social History  . Marital Status: Married    Spouse Name: N/A  . Number of Children: N/A  . Years of Education: N/A   Occupational History  . Not on file.   Social History Main Topics  . Smoking status: Never Smoker   . Smokeless tobacco: Never Used  . Alcohol Use: Yes     Comment: RARE  . Drug Use: No  . Sexual Activity: Not on file   Other Topics Concern   . Not on file   Social History Narrative    Family History: Family History  Problem Relation Age of Onset  . Cancer Mother     Ovarian  . Cancer Father     Liver  . Heart disease Father     Review of Systems: Positive: Right flank pain, hematuria Negative: A further 10 point review of systems was negative except what is listed in the HPI.                  Physical Exam: @VITALS2 @ General: No acute distress.  Awake. Head:  Normocephalic.  Atraumatic. ENT:  EOMI.  Mucous membranes moist Neck:  Supple.  No lymphadenopathy. CV:  S1 present. S2 present. Regular rate. Pulmonary: Equal effort bilaterally.  Clear to auscultation bilaterally. Abdomen: Soft.  Non tender to palpation. Skin:  Normal turgor.  No visible rash. Extremity: No gross deformity of bilateral upper extremities.  No gross deformity of                             lower extremities. Neurologic: Alert. Appropriate mood.    Studies:  No results for input(s): HGB, WBC, PLT in the last 72 hours.  No results for input(s): NA, K, CL, CO2, BUN, CREATININE, CALCIUM, GFRNONAA, GFRAA in the last 72 hours.  Invalid input(s): MAGNESIUM   No results for input(s): INR, APTT in the last 72 hours.  Invalid input(s): PT   Invalid input(s): ABG    Assessment:  Right ureteral, renal and bladder calculi  Plan: Cysto, cystolitholapaxy, right retrograde pyelogram, right ureteral and renal stone extraction w/ laser

## 2014-07-21 NOTE — Transfer of Care (Signed)
Immediate Anesthesia Transfer of Care Note  Patient: Ricardo Little  Procedure(s) Performed: Procedure(s): CYSTOSCOPY WITH RETROGRADE PYELOGRAM, URETEROSCOPY ,STONE EXTRACTION AND STENT PLACEMENT (Right) HOLMIUM LASER APPLICATION (Right) CYSTOSCOPY WITH LITHOLAPAXY (N/A)  Patient Location: PACU  Anesthesia Type:General  Level of Consciousness: awake, alert  and oriented  Airway & Oxygen Therapy: Patient Spontanous Breathing and Patient connected to nasal cannula oxygen  Post-op Assessment: Report given to RN  Post vital signs: Reviewed and stable  Last Vitals:  Filed Vitals:   07/21/14 0654  BP: 129/76  Pulse: 64  Temp: 36.6 C  Resp: 24    Complications: No apparent anesthesia complications

## 2014-07-21 NOTE — Anesthesia Postprocedure Evaluation (Signed)
  Anesthesia Post-op Note  Patient: Ricardo ClanKenneth J Gumz  Procedure(s) Performed: Procedure(s) (LRB): CYSTOSCOPY WITH RETROGRADE PYELOGRAM, URETEROSCOPY ,STONE EXTRACTION AND STENT PLACEMENT (Right) HOLMIUM LASER APPLICATION (Right) CYSTOSCOPY WITH LITHOLAPAXY (N/A)  Patient Location: PACU  Anesthesia Type: General  Level of Consciousness: awake and alert   Airway and Oxygen Therapy: Patient Spontanous Breathing  Post-op Pain: mild  Post-op Assessment: Post-op Vital signs reviewed, Patient's Cardiovascular Status Stable, Respiratory Function Stable, Patent Airway and No signs of Nausea or vomiting  Last Vitals:  Filed Vitals:   07/21/14 1207  BP: 140/77  Pulse:   Temp: 36.6 C  Resp: 16    Post-op Vital Signs: stable   Complications: No apparent anesthesia complications

## 2014-07-21 NOTE — Op Note (Signed)
PATIENT:  Ricardo Little  PRE-OPERATIVE DIAGNOSIS: Bladder, right ureteral and renal calculi  POST-OPERATIVE DIAGNOSIS: Same  PROCEDURE: Cystoscopy, cystolitholapaxy, right retrograde pyelogram with interpretive fluoroscopy, right ureteroscopy, holmium laser and extraction of right ureteral stone, extraction of renal stone, placement of double-J stent  SURGEON:  Bertram MillardStephen M. Kyandre Okray, M.D.  ANESTHESIA:  General  EBL:  Minimal  DRAINS: 6 [place]French by 24 cm contour double-J stent with string  LOCAL MEDICATIONS USED:  None  SPECIMEN:  Bladder, ureteral and renal calculi, to the patient's wife  INDICATION: Ricardo ClanKenneth J Delarocha is a 64 year old male with symptomatic right ureteral calculus, multiple bladder calculi and renal calculi. Because of his nonprogression and right ureteral stone, it was recommended that he undergo management. Because the patient has multiple bladder calculi, most likely due to bladder outlet obstruction, it was recommended that he undergo a ureteroscopic management of the right ureteral stone as well as cystolitholapaxy of the bladder calculi and ureteroscopy with extraction of small renal calculi. The procedure, risks and calcifications have been discussed with the patient. He understands these and desires to proceed.  Description of procedure: The patient was properly identified and marked (if applicable) in the holding area. They were then  taken to the operating room and placed on the table in a supine position. General anesthesia was then administered. Once fully anesthetized the patient was moved to the dorsolithotomy position and the genitalia and perineum were sterilely prepped and draped in standard fashion. An official timeout was then performed.  A 23 French cystoscope was advanced to the patient's urethra. Urethra was free of lesions. Prostatic urethra was obstructed due to trilobar hypertrophy. The bladder was entered and inspected circumferentially. There are  multiple small bladder calculi that were quite dark, brown, and round. There was one larger 8 mm stone. These were removed with irrigation and with direct drainage of the bladder over top of the stones. No further stones were present within the bladder at this point. There were no bladder tumors. There were mild trabeculations. Both ureteral orifices were normal in configuration and location. The right ureteral orifice was cannulated with a 6 [place]French open-ended catheter. Retrograde pyelogram was performed.  This revealed a normal caliber ureter throughout. There was a filling defect over the mid ureter consistent with the ureteral stone. No other filling defects were noted. Pyelo-calyceal system was normal on the right.  At this point, a 0.38 inch sensor-tip guidewire was advanced through the ureteral access catheter, up to the right renal pelvis where good curl was seen. The open-ended catheter was removed. I then removed the cystoscope. I dilated the patient's distal/mid ureter first using the inner core and then the entire 12/14 ureteral access sheath. The access sheath was then removed. I then passed the ureteroscope directly through the ureter and up to the right ureteral orifice which was easily entered. The scope was advanced up to the mid ureter where the stone was seen. It was somewhat impacted. I first attempted to extracted using the engage basket. This was unsuccessful. I then removed the engage basket, and passed a 200  laser fiber. The stone was then fragmented, first using a power of 0.2 J and frequency of 10 Hz. The power was then increased to 0.5 J. Fragmentation was then obtained. I removed the laser fiber and extracted all the small fragments using the engage basket. Some of these moved more proximal in the ureter. These were easily grasped and extracted. Once the ureter was clear, I removed the ureteroscope.  I then passed the access catheter over the guidewire all the way to the proximal  ureter. The inner core was removed. Using the flexible digital ureteroscope, I entered the renal pelvis. I inspected all the calyces. There were early 2 small stones that were seen within the pyelo-calyceal system. These were grasped with the engage basket and removed through the sheath. Careful inspection of the pelvis and all of the calyceal system was made. No further stones were seen. At this point, the ureteroscope was removed. I then replaced the 0.038 inch sensor-tip guidewire through the ureteral access sheath, and remove the access sheath. The guidewire was backloaded through the scope which was then passed into the bladder. Over top of the guidewire, a 24 cm x 6 French contour double-J stent was placed. The string was left on. The bladder was drained and the scope removed. The string was then cut down somewhat, knotted just outside the urethra, and taped to the penis. Small stones were gathered and placed in a specimen jar given eventually the patient's wife.  At this point the procedure was terminated. The patient was awakened and taken to PACU in stable condition. He tolerated the procedure well.

## 2014-07-21 NOTE — Anesthesia Preprocedure Evaluation (Signed)
Anesthesia Evaluation  Patient identified by MRN, date of birth, ID band Patient awake    Reviewed: Allergy & Precautions, NPO status , Patient's Chart, lab work & pertinent test results  Airway Mallampati: II  TM Distance: >3 FB Neck ROM: Full    Dental no notable dental hx.    Pulmonary neg pulmonary ROS,  breath sounds clear to auscultation  Pulmonary exam normal       Cardiovascular hypertension, Pt. on medications Normal cardiovascular examRhythm:Regular Rate:Normal     Neuro/Psych negative neurological ROS  negative psych ROS   GI/Hepatic negative GI ROS, Neg liver ROS,   Endo/Other  negative endocrine ROS  Renal/GU negative Renal ROS  negative genitourinary   Musculoskeletal negative musculoskeletal ROS (+)   Abdominal   Peds negative pediatric ROS (+)  Hematology negative hematology ROS (+)   Anesthesia Other Findings   Reproductive/Obstetrics negative OB ROS                             Anesthesia Physical Anesthesia Plan  ASA: II  Anesthesia Plan: General   Post-op Pain Management:    Induction: Intravenous  Airway Management Planned: LMA  Additional Equipment:   Intra-op Plan:   Post-operative Plan:   Informed Consent: I have reviewed the patients History and Physical, chart, labs and discussed the procedure including the risks, benefits and alternatives for the proposed anesthesia with the patient or authorized representative who has indicated his/her understanding and acceptance.   Dental advisory given  Plan Discussed with: CRNA  Anesthesia Plan Comments:         Anesthesia Quick Evaluation

## 2014-07-22 ENCOUNTER — Encounter (HOSPITAL_BASED_OUTPATIENT_CLINIC_OR_DEPARTMENT_OTHER): Payer: Self-pay | Admitting: Urology

## 2015-03-14 IMAGING — CR DG WRIST COMPLETE 3+V*L*
2 series · 2 of 2 positions shown · non-contrast
Comparison: None.

CLINICAL DATA: Fell on the ice in injured his left wrist.

EXAM:
LEFT WRIST - COMPLETE 3+ VIEW

[PA]
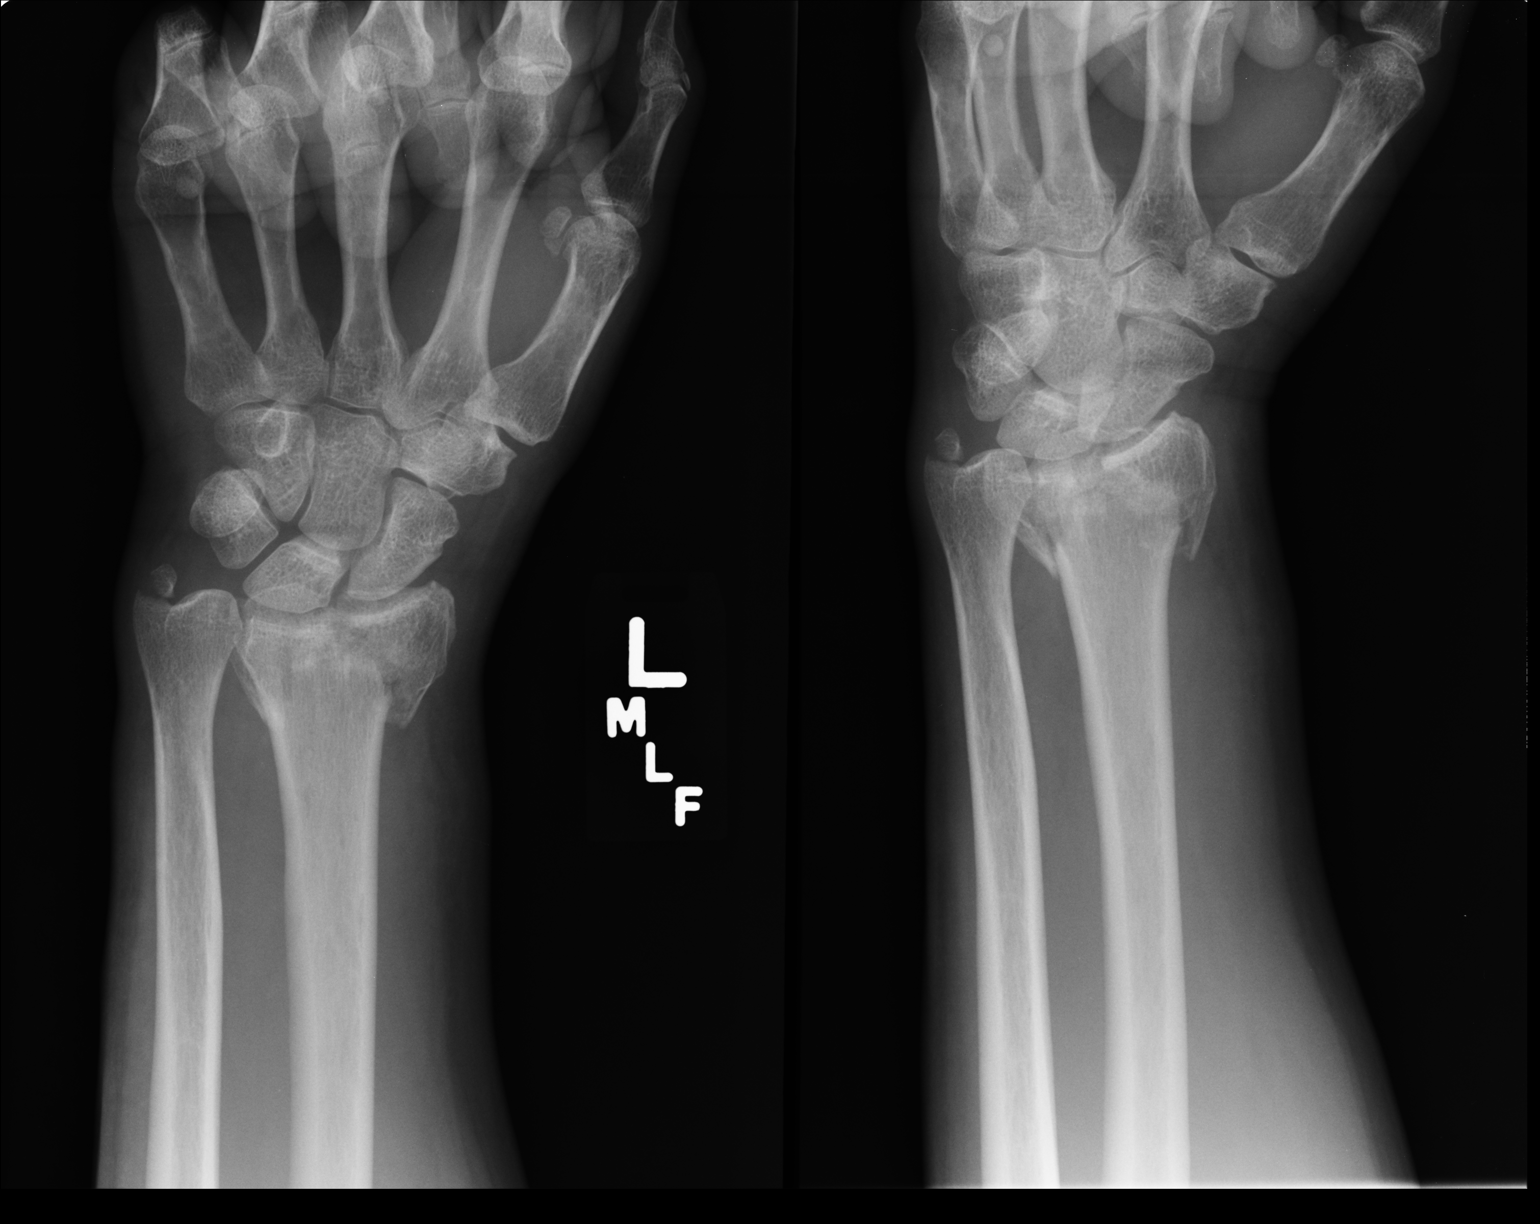

[pa int rot]
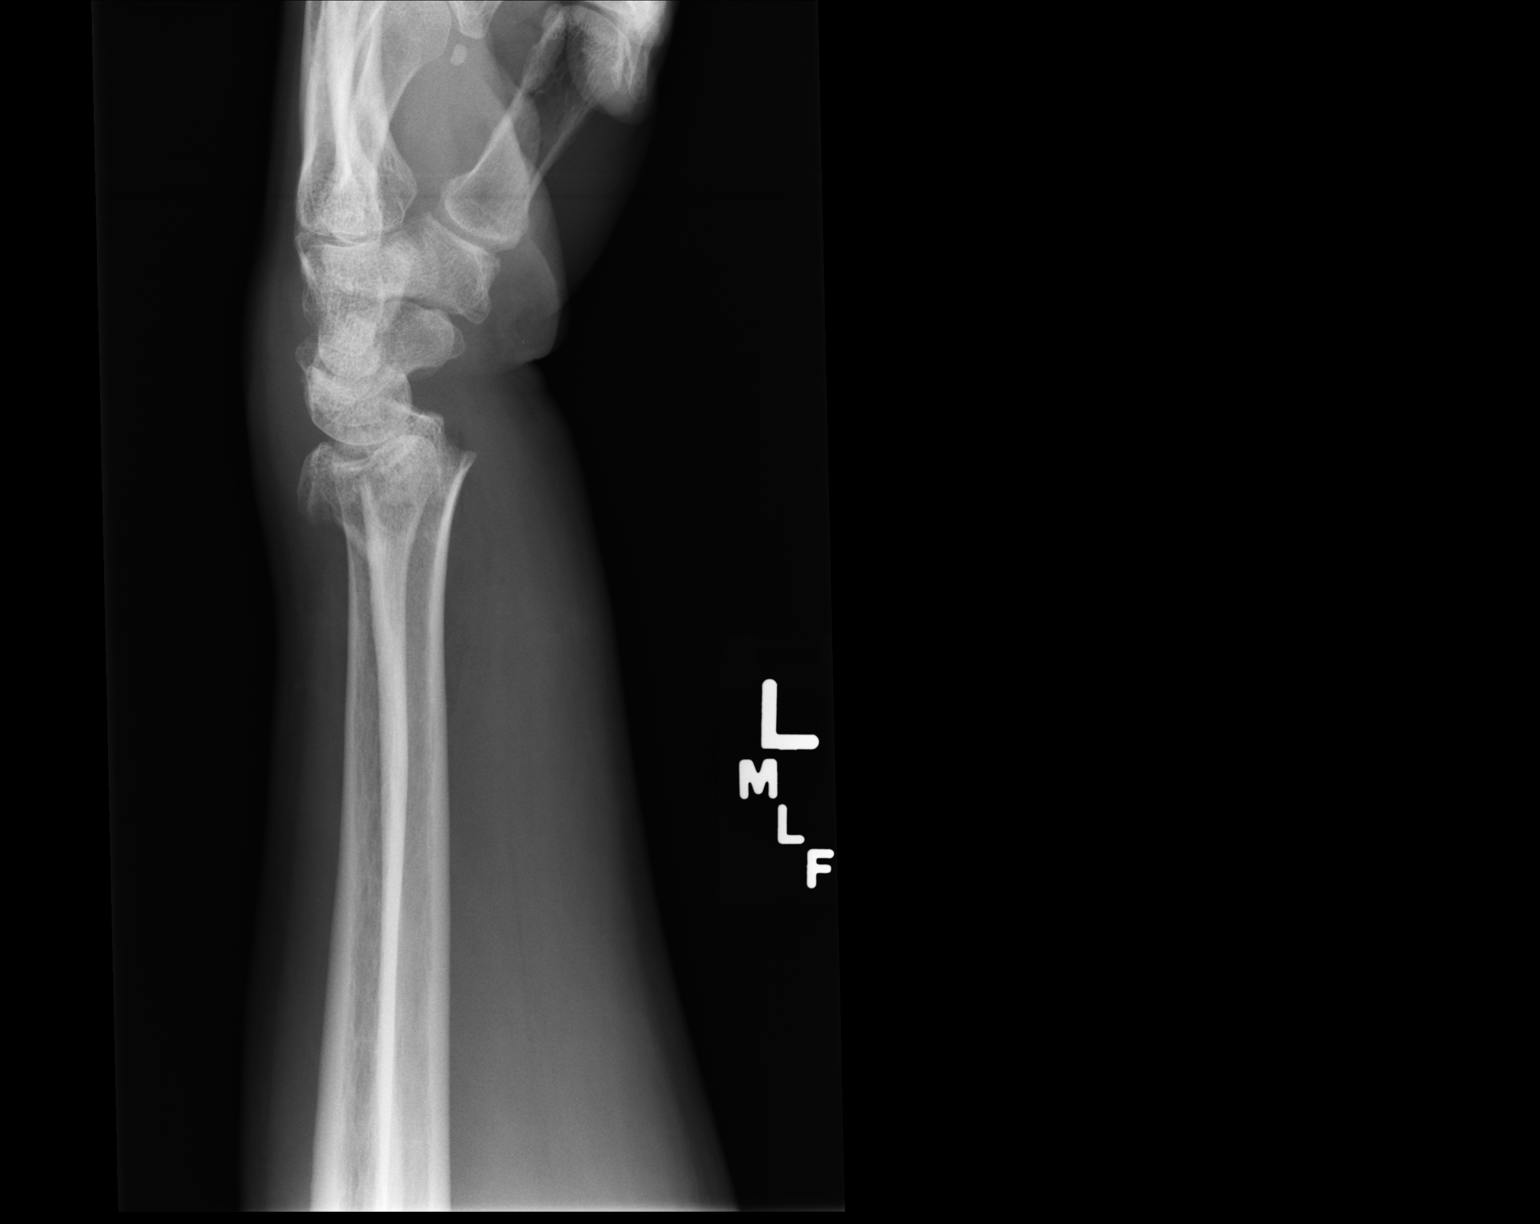

[2 of 2 positions shown; findings below may reference images not displayed]

FINDINGS: Comminuted impacted fracture involving the distal radius with
extension to the articular surface, the medial and lateral radial
condyles present as free fracture fragments. Slight dorsal
angulation of the distal fragment. Fracture involving the ulnar
styloid. Carpal bones intact.
IMPRESSION: 1. Comminuted impacted intra-articular fracture involving the distal
radius with slight dorsal angulation.
2. Ulnar styloid fracture.
No significant discrepancy with the original interpretation by Dr.
Jacinto.

## 2016-12-31 ENCOUNTER — Other Ambulatory Visit: Payer: Self-pay | Admitting: Urology

## 2017-01-01 ENCOUNTER — Encounter (HOSPITAL_BASED_OUTPATIENT_CLINIC_OR_DEPARTMENT_OTHER): Payer: Self-pay | Admitting: *Deleted

## 2017-01-01 NOTE — Progress Notes (Signed)
NPO AFTER MN.  ARRIVE AT 0730.  NEEDS ISTAT 8 AND EKG.  WILL TAKE ZOLOFT AND LIPITOR AM DOS W/ SIPS OF WATER.

## 2017-01-06 ENCOUNTER — Ambulatory Visit (HOSPITAL_BASED_OUTPATIENT_CLINIC_OR_DEPARTMENT_OTHER)
Admission: RE | Admit: 2017-01-06 | Discharge: 2017-01-06 | Disposition: A | Discharge status: Home / Self Care | Payer: BC Managed Care – PPO | Source: Ambulatory Visit | Attending: Urology | Admitting: Urology

## 2017-01-06 ENCOUNTER — Ambulatory Visit (HOSPITAL_BASED_OUTPATIENT_CLINIC_OR_DEPARTMENT_OTHER): Payer: BC Managed Care – PPO | Admitting: Anesthesiology

## 2017-01-06 ENCOUNTER — Encounter (HOSPITAL_BASED_OUTPATIENT_CLINIC_OR_DEPARTMENT_OTHER): Payer: Self-pay | Admitting: *Deleted

## 2017-01-06 ENCOUNTER — Other Ambulatory Visit: Payer: Self-pay

## 2017-01-06 ENCOUNTER — Encounter (HOSPITAL_BASED_OUTPATIENT_CLINIC_OR_DEPARTMENT_OTHER)
Admission: RE | Disposition: A | Discharge status: Home / Self Care | Payer: Self-pay | Source: Ambulatory Visit | Attending: Urology

## 2017-01-06 DIAGNOSIS — Z87442 Personal history of urinary calculi: Secondary | ICD-10-CM | POA: Insufficient documentation

## 2017-01-06 DIAGNOSIS — E785 Hyperlipidemia, unspecified: Secondary | ICD-10-CM | POA: Insufficient documentation

## 2017-01-06 DIAGNOSIS — N401 Enlarged prostate with lower urinary tract symptoms: Secondary | ICD-10-CM | POA: Diagnosis present

## 2017-01-06 DIAGNOSIS — R339 Retention of urine, unspecified: Secondary | ICD-10-CM | POA: Diagnosis not present

## 2017-01-06 DIAGNOSIS — Z79899 Other long term (current) drug therapy: Secondary | ICD-10-CM | POA: Diagnosis not present

## 2017-01-06 DIAGNOSIS — Z7982 Long term (current) use of aspirin: Secondary | ICD-10-CM | POA: Insufficient documentation

## 2017-01-06 DIAGNOSIS — I10 Essential (primary) hypertension: Secondary | ICD-10-CM | POA: Diagnosis not present

## 2017-01-06 HISTORY — DX: Other specified health status: Z78.9

## 2017-01-06 HISTORY — DX: Benign prostatic hyperplasia with lower urinary tract symptoms: N40.1

## 2017-01-06 HISTORY — PX: CYSTOSCOPY WITH INSERTION OF UROLIFT: SHX6678

## 2017-01-06 HISTORY — DX: Other obstructive and reflux uropathy: N13.8

## 2017-01-06 HISTORY — DX: Retention of urine, unspecified: R33.9

## 2017-01-06 LAB — POCT I-STAT, CHEM 8
BUN: 21 mg/dL — AB (ref 6–20)
CALCIUM ION: 1.25 mmol/L (ref 1.15–1.40)
CREATININE: 1.2 mg/dL (ref 0.61–1.24)
Chloride: 103 mmol/L (ref 101–111)
GLUCOSE: 99 mg/dL (ref 65–99)
HCT: 44 % (ref 39.0–52.0)
Hemoglobin: 15 g/dL (ref 13.0–17.0)
Potassium: 4.2 mmol/L (ref 3.5–5.1)
Sodium: 139 mmol/L (ref 135–145)
TCO2: 24 mmol/L (ref 22–32)

## 2017-01-06 SURGERY — CYSTOSCOPY WITH INSERTION OF UROLIFT
Anesthesia: General | Site: Bladder

## 2017-01-06 MED ORDER — PROPOFOL 10 MG/ML IV BOLUS
INTRAVENOUS | Status: AC
  Filled 2017-01-06: qty 40

## 2017-01-06 MED ORDER — STERILE WATER FOR IRRIGATION IR SOLN
Status: DC | PRN
  Administered 2017-01-06: 6000 mL via INTRAVESICAL

## 2017-01-06 MED ORDER — DEXAMETHASONE SODIUM PHOSPHATE 10 MG/ML IJ SOLN
INTRAMUSCULAR | Status: DC | PRN
  Administered 2017-01-06: 10 mg via INTRAVENOUS

## 2017-01-06 MED ORDER — MIDAZOLAM HCL 2 MG/2ML IJ SOLN
INTRAMUSCULAR | Status: AC
  Filled 2017-01-06: qty 2

## 2017-01-06 MED ORDER — TRAMADOL HCL 50 MG PO TABS: 50.0000 mg | ORAL_TABLET | Freq: Four times a day (QID) | ORAL | 0 refills | Status: DC | PRN

## 2017-01-06 MED ORDER — KETOROLAC TROMETHAMINE 30 MG/ML IJ SOLN
30.0000 mg | Freq: Once | INTRAMUSCULAR | Status: DC | PRN
  Filled 2017-01-06: qty 1

## 2017-01-06 MED ORDER — KETOROLAC TROMETHAMINE 30 MG/ML IJ SOLN
INTRAMUSCULAR | Status: AC
  Filled 2017-01-06: qty 1

## 2017-01-06 MED ORDER — CEFAZOLIN SODIUM-DEXTROSE 2-4 GM/100ML-% IV SOLN
INTRAVENOUS | Status: AC
  Filled 2017-01-06: qty 100

## 2017-01-06 MED ORDER — PROMETHAZINE HCL 25 MG/ML IJ SOLN
6.2500 mg | INTRAMUSCULAR | Status: DC | PRN
  Filled 2017-01-06: qty 1

## 2017-01-06 MED ORDER — ONDANSETRON HCL 4 MG/2ML IJ SOLN
INTRAMUSCULAR | Status: AC
  Filled 2017-01-06: qty 2

## 2017-01-06 MED ORDER — CEPHALEXIN 500 MG PO CAPS: 500.0000 mg | ORAL_CAPSULE | Freq: Two times a day (BID) | ORAL | 0 refills | Status: DC

## 2017-01-06 MED ORDER — ONDANSETRON HCL 4 MG/2ML IJ SOLN
INTRAMUSCULAR | Status: DC | PRN
  Administered 2017-01-06: 4 mg via INTRAVENOUS

## 2017-01-06 MED ORDER — LIDOCAINE 2% (20 MG/ML) 5 ML SYRINGE
INTRAMUSCULAR | Status: DC | PRN
  Administered 2017-01-06: 100 mg via INTRAVENOUS

## 2017-01-06 MED ORDER — DEXAMETHASONE SODIUM PHOSPHATE 10 MG/ML IJ SOLN
INTRAMUSCULAR | Status: AC
  Filled 2017-01-06: qty 1

## 2017-01-06 MED ORDER — FENTANYL CITRATE (PF) 100 MCG/2ML IJ SOLN
25.0000 ug | INTRAMUSCULAR | Status: DC | PRN
  Filled 2017-01-06: qty 1

## 2017-01-06 MED ORDER — FENTANYL CITRATE (PF) 100 MCG/2ML IJ SOLN
INTRAMUSCULAR | Status: AC
  Filled 2017-01-06: qty 2

## 2017-01-06 MED ORDER — KETOROLAC TROMETHAMINE 30 MG/ML IJ SOLN
INTRAMUSCULAR | Status: DC | PRN
  Administered 2017-01-06: 15 mg via INTRAVENOUS

## 2017-01-06 MED ORDER — LIDOCAINE 2% (20 MG/ML) 5 ML SYRINGE
INTRAMUSCULAR | Status: AC
  Filled 2017-01-06: qty 5

## 2017-01-06 MED ORDER — MIDAZOLAM HCL 5 MG/5ML IJ SOLN
INTRAMUSCULAR | Status: DC | PRN
  Administered 2017-01-06: 2 mg via INTRAVENOUS

## 2017-01-06 MED ORDER — CEFAZOLIN SODIUM-DEXTROSE 2-3 GM-%(50ML) IV SOLR
INTRAVENOUS | Status: DC | PRN
  Administered 2017-01-06: 2 g via INTRAVENOUS

## 2017-01-06 MED ORDER — LACTATED RINGERS IV SOLN
INTRAVENOUS | Status: DC
  Administered 2017-01-06: 1000 mL via INTRAVENOUS
  Filled 2017-01-06: qty 1000

## 2017-01-06 MED ORDER — FENTANYL CITRATE (PF) 100 MCG/2ML IJ SOLN
INTRAMUSCULAR | Status: DC | PRN
  Administered 2017-01-06: 50 ug via INTRAVENOUS

## 2017-01-06 MED ORDER — PROPOFOL 10 MG/ML IV BOLUS
INTRAVENOUS | Status: DC | PRN
  Administered 2017-01-06: 200 mg via INTRAVENOUS

## 2017-01-06 SURGICAL SUPPLY — 15 items
BAG DRAIN URO-CYSTO SKYTR STRL (DRAIN) ×3 IMPLANT
BAG URINE DRAINAGE (UROLOGICAL SUPPLIES) IMPLANT
BAG URINE LEG 19OZ MD ST LTX (BAG) IMPLANT
BAG URINE LEG 500ML (DRAIN) IMPLANT
CATH FOLEY 2WAY SLVR  5CC 18FR (CATHETERS)
CATH FOLEY 2WAY SLVR 5CC 18FR (CATHETERS) IMPLANT
CLOTH BEACON ORANGE TIMEOUT ST (SAFETY) ×3 IMPLANT
GLOVE BIO SURGEON STRL SZ8 (GLOVE) ×3 IMPLANT
GOWN STRL REUS W/TWL XL LVL3 (GOWN DISPOSABLE) ×3 IMPLANT
MANIFOLD NEPTUNE II (INSTRUMENTS) ×3 IMPLANT
PACK CYSTO (CUSTOM PROCEDURE TRAY) ×3 IMPLANT
SYSTEM UROLIFT (Male Continence) ×18 IMPLANT
TUBE CONNECTING 12'X1/4 (SUCTIONS) ×1
TUBE CONNECTING 12X1/4 (SUCTIONS) ×2 IMPLANT
WATER STERILE IRR 3000ML UROMA (IV SOLUTION) ×6 IMPLANT

## 2017-01-06 NOTE — Anesthesia Postprocedure Evaluation (Signed)
Anesthesia Post Note  Patient: Ricardo Little  Procedure(s) Performed: CYSTOSCOPY WITH INSERTION OF UROLIFT (N/A Bladder)     Patient location during evaluation: PACU Anesthesia Type: General Level of consciousness: awake and alert Pain management: pain level controlled Vital Signs Assessment: post-procedure vital signs reviewed and stable Respiratory status: spontaneous breathing, nonlabored ventilation, respiratory function stable and patient connected to nasal cannula oxygen Cardiovascular status: blood pressure returned to baseline and stable Postop Assessment: no apparent nausea or vomiting Anesthetic complications: no    Last Vitals:  Vitals:   01/06/17 0949 01/06/17 1000  BP: 119/76 120/75  Pulse: 74 79  Resp: (!) 21 (!) 21  Temp: (!) 38.1 C   SpO2: 99% 100%    Last Pain:  Vitals:   01/06/17 0722  TempSrc: Oral                 Marchia Diguglielmo S

## 2017-01-06 NOTE — Anesthesia Procedure Notes (Signed)
Performed by: Erskine Steinfeldt T       

## 2017-01-06 NOTE — H&P (Signed)
H&P  Chief Complaint: Urinary retention  History of Present Illness: 66 year old male with BPH and subsequent retention. He has been managed with cic. He has had UDS which revealed normal bladder contractility with evidence of obstruction. Cystoscopy revealed bilobar hypertrophy. He presents for a Urolift procedure as well as cystolithalopaxy.  Past Medical History:  Diagnosis Date  . Arthritis    KNEE  . BPH with urinary obstruction   . History of benign thyroid tumor   . History of kidney stones   . Hyperlipidemia   . Hypertension   . Nephrolithiasis   . Self-catheterizes urinary bladder   . Urinary retention     Past Surgical History:  Procedure Laterality Date  . CYSTOSCOPY WITH LITHOLAPAXY N/A 07/21/2014   Procedure: CYSTOSCOPY WITH LITHOLAPAXY;  Surgeon: Marcine Matar, MD;  [facility];  Service: Urology;  Laterality: N/A;  . CYSTOSCOPY WITH RETROGRADE PYELOGRAM, URETEROSCOPY AND STENT PLACEMENT Bilateral 08/27/2012   Procedure: CYSTOSCOPY WITH BILATERAL RETROGRADE PYELOGRAM, BILATERAL URETEROSCOPY , STENT PLACEMENT  ;  Surgeon: Marcine Matar, MD;  Location: [facility];  Service: Urology;  Laterality: Bilateral;  . CYSTOSCOPY WITH RETROGRADE PYELOGRAM, URETEROSCOPY AND STENT PLACEMENT Right 07/21/2014   Procedure: CYSTOSCOPY WITH RETROGRADE PYELOGRAM, URETEROSCOPY ,STONE EXTRACTION AND STENT PLACEMENT;  Surgeon: Marcine Matar, MD;  Location: [facility];  Service: Urology;  Laterality: Right;  . HOLMIUM LASER APPLICATION Bilateral 08/27/2012   Procedure: HOLMIUM LASER EXTRACTION OF STONES;  Surgeon: Marcine Matar, MD;  Location: [facility];  Service: Urology;  Laterality: Bilateral;  . HOLMIUM LASER APPLICATION Right 07/21/2014   Procedure: HOLMIUM LASER APPLICATION;  Surgeon: Marcine Matar, MD;  Location: [facility];  Service: Urology;  Laterality: Right;  . KNEE ARTHROSCOPY  W/ MENISCECTOMY  JUNE 2008  . LEFT THYROID LOBECTOMY  12-31-2005   HURTHLE CELL TUMOR  . OPEN REDUCTION INTERNAL FIXATION (ORIF) DISTAL RADIAL FRACTURE Left 05/01/2013   Procedure: OPEN REDUCTION INTERNAL FIXATION (ORIF) DISTAL RADIAL FRACTURE;  Surgeon: Sharma Covert, MD;  Location: [facility];  Service: Orthopedics;  Laterality: Left;    Home Medications:  Allergies as of 01/06/2017   No Known Allergies     Medication List    Notice   Cannot display discharge medications because the patient has not yet been admitted.     Allergies: No Known Allergies  Family History  Problem Relation Age of Onset  . Cancer Mother        Ovarian  . Cancer Father        Liver  . Heart disease Father     Social History:  reports that he has never smoked. He has never used smokeless tobacco. He reports that he drinks alcohol. He reports that he does not use drugs.  ROS: A complete review of systems was performed.  All systems are negative except for pertinent findings as noted.  Physical Exam:  Vital signs in last 24 hours:   Constitutional:  Alert and oriented, No acute distress Cardiovascular: Regular rate and rhythm, No JVD Respiratory: Normal respiratory effort, Lungs clear bilaterally GI: Abdomen is soft, nontender, nondistended, no abdominal masses Genitourinary: No CVAT. Normal male phallus, testes are descended bilaterally and non-tender and without masses, scrotum is normal in appearance without lesions or masses, perineum is normal on inspection. Rectal: Normal sphincter tone, no rectal masses, prostate is non tender and without nodularity. Prostate size is estimated to be 50 cc Lymphatic: No lymphadenopathy Neurologic: Grossly intact, no  focal deficits Psychiatric: Normal mood and affect  Laboratory Data:  No results for input(s): WBC, HGB, HCT, PLT in the last 72 hours.  No results for input(s): NA, K, CL, GLUCOSE, BUN, CALCIUM, CREATININE in the last 72 hours.  Invalid  input(s): CO3   No results found for this or any previous visit (from the past 24 hour(s)). No results found for this or any previous visit (from the past 240 hour(s)).  Renal Function: No results for input(s): CREATININE in the last 168 hours. CrCl cannot be calculated (Patient's most recent lab result is older than the maximum 21 days allowed.).  Radiologic Imaging: No results found.  Impression/Assessment:  Bladder calculi, BPH with retention  Plan:  Urolift, cystolithalopaxy

## 2017-01-06 NOTE — Discharge Instructions (Signed)
1. You may see some blood in the urine and may have some burning with urination for 48-72 hours. You also may notice that you have to urinate more frequently or urgently after your procedure which is normal.  2. You should call should you develop an inability urinate, fever > 101, persistent nausea and vomiting that prevents you from eating or drinking to stay hydrated.  3. If you have a catheter, you will be taught how to take care of the catheter by the nursing staff prior to discharge from the hospital.  You may periodically feel a strong urge to void with the catheter in place.  This is a bladder spasm and most often can occur when having a bowel movement or moving around. It is typically self-limited and usually will stop after a few minutes.  You may use some Vaseline or Neosporin around the tip of the catheter to reduce friction at the tip of the penis. You may also see some blood in the urine.  A very small amount of blood can make the urine look quite red.  As long as the catheter is draining well, there usually is not a problem.  However, if the catheter is not draining well and is bloody, you should call the office [phone]) to notify us.      4.   Expect that there may be some blood dripping out.  Your penis for the next few days.      5.   It is okay to continue to catheterize if needed   Post Anesthesia Home Care Instructions  Activity: Get plenty of rest for the remainder of the day. A responsible individual must stay with you for 24 hours following the procedure.  For the next 24 hours, DO NOT: -Drive a car -Advertising copywriterperate machinery -Drink alcoholic beverages -Take any medication unless instructed by your physician -Make any legal decisions or sign important papers.  Meals: Start with liquid foods such as gelatin or soup. Progress to regular foods as tolerated. Avoid greasy, spicy, heavy foods. If nausea and/or vomiting occur, drink only clear liquids until the nausea and/or vomiting  subsides. Call your physician if vomiting continues.  Special Instructions/Symptoms: Your throat may feel dry or sore from the anesthesia or the breathing tube placed in your throat during surgery. If this causes discomfort, gargle with warm salt water. The discomfort should disappear within 24 hours.  If you had a scopolamine patch placed behind your ear for the management of post- operative nausea and/or vomiting:  1. The medication in the patch is effective for 72 hours, after which it should be removed.  Wrap patch in a tissue and discard in the trash. Wash hands thoroughly with soap and water. 2. You may remove the patch earlier than 72 hours if you experience unpleasant side effects which may include dry mouth, dizziness or visual disturbances. 3. Avoid touching the patch. Wash your hands with soap and water after contact with the patch.

## 2017-01-06 NOTE — Transfer of Care (Signed)
Immediate Anesthesia Transfer of Care Note  Patient: Ricardo Little  Procedure(s) Performed: CYSTOSCOPY WITH INSERTION OF UROLIFT (N/A Bladder)  Patient Location: PACU  Anesthesia Type:General  Level of Consciousness: awake, alert  and oriented  Airway & Oxygen Therapy: Patient Spontanous Breathing and Patient connected to nasal cannula oxygen  Post-op Assessment: Report given to RN  Post vital signs: Reviewed and stable  Last Vitals:  Vitals:   01/06/17 0722 01/06/17 0949  BP: 122/76 (P) 119/76  Pulse: 85 (P) 74  Resp: 16   Temp: (!) 36.3 C (!) (P) 38.1 C  SpO2: 99% (P) 99%    Last Pain:  Vitals:   01/06/17 0722  TempSrc: Oral      Patients Stated Pain Goal: 5 (01/06/17 0729)  Complications: No apparent anesthesia complications

## 2017-01-06 NOTE — Anesthesia Preprocedure Evaluation (Signed)
Anesthesia Evaluation  Patient identified by MRN, date of birth, ID band Patient awake    Reviewed: Allergy & Precautions, NPO status , Patient's Chart, lab work & pertinent test results  Airway Mallampati: II  TM Distance: >3 FB Neck ROM: Full    Dental no notable dental hx.    Pulmonary neg pulmonary ROS,    Pulmonary exam normal breath sounds clear to auscultation       Cardiovascular hypertension, Normal cardiovascular exam Rhythm:Regular Rate:Normal     Neuro/Psych negative neurological ROS  negative psych ROS   GI/Hepatic negative GI ROS, Neg liver ROS,   Endo/Other  negative endocrine ROS  Renal/GU negative Renal ROS  negative genitourinary   Musculoskeletal negative musculoskeletal ROS (+)   Abdominal   Peds negative pediatric ROS (+)  Hematology negative hematology ROS (+)   Anesthesia Other Findings   Reproductive/Obstetrics negative OB ROS                            Anesthesia Physical Anesthesia Plan  ASA: II  Anesthesia Plan: General   Post-op Pain Management:    Induction: Intravenous  PONV Risk Score and Plan: 1 and Ondansetron and Dexamethasone  Airway Management Planned: LMA  Additional Equipment:   Intra-op Plan:   Post-operative Plan: Extubation in OR  Informed Consent: I have reviewed the patients History and Physical, chart, labs and discussed the procedure including the risks, benefits and alternatives for the proposed anesthesia with the patient or authorized representative who has indicated his/her understanding and acceptance.   Dental advisory given  Plan Discussed with: CRNA and Surgeon  Anesthesia Plan Comments:         Anesthesia Quick Evaluation  

## 2017-01-06 NOTE — Interval H&P Note (Signed)
History and Physical Interval Note:  01/06/2017 8:50 AM  Ricardo Little  has presented today for surgery, with the diagnosis of BENIGN PROSTATIC HYPERPLASIA WITH OBSTRUCTION  The various methods of treatment have been discussed with the patient and family. After consideration of risks, benefits and other options for treatment, the patient has consented to  Procedure(s): CYSTOSCOPY WITH INSERTION OF UROLIFT (N/A) as a surgical intervention .  The patient's history has been reviewed, patient examined, no change in status, stable for surgery.  I have reviewed the patient's chart and labs.  Questions were answered to the patient's satisfaction.     Chelsea AusDAHLSTEDT, Bowie Doiron M

## 2017-01-06 NOTE — Op Note (Signed)
Preoperative diagnosis: BPH with obstructive symptomatology.  Postoperative diagnosis: Same  Principal procedure: Urolift procedure, with the placement of 4 implants (2 implants did not engage).  Surgeon: Retta Dionesahlstedt  Anesthesia: Gen. with LMA  Complications: None  Drains: 16 French Foley catheter, to leg bag.  Estimated blood loss: Less than 25 mL  Indications: 66 -year-old male with obstructive symptomatology secondary to BPH.  The patient's symptoms have progressed to the point of him being in retention and using intermittent catheterization, and he has requested further management.  Management options including TURP with resection/ablation of the prostate as well as Urolift were discussed.  The patient has chosen to have a Urolift procedure.  He has been instructed to the procedure as well as risks and complications which include but are not limited to infection, bleeding, and inadequate treatment with the Urolift procedure alone, anesthetic complications, among others.  He understands these and desires to proceed.  Findings: Using the 17 French cystoscope, urethra and bladder were inspected.  There were no urethral lesions.  Prostatic urethra was obstructed secondary to bilobar hypertrophy.  The bladder was inspected circumferentially.  This revealed trabeculations and a small diverticulum in the left posterior wall.  Small stones that were present on a prior cystoscopy were not present.  It is felt that they were passed.  Description of procedure: The patient was properly identified in the holding area.  He received preoperative IV antibiotics.  He was taken to the operating room where general anesthetic was administered with the LMA.  He is placed in the dorsolithotomy position.  Genitalia and perineum were prepped and draped.  Proper timeout was performed.  A 73F cystoscope was inserted into the bladder. The cystoscopy bridge was replaced with a UroLift delivery device.The first treatment  site was the patient's right side approximately 1.5cm distal to the bladder neck. The distal tip of the delivery device was then angled laterally approximately 20 degrees at this position to compress the lateral lobe. The trigger was pulled, thereby deploying a needle containing the implant through the prostate. The needle was then retracted, allowing one end of the implant to be delivered to the capsular surface of the prostate. The implant was then tensioned to assure capsular seating and removal of slack monofilament. The device was then angled back toward midline and slowly advanced proximally until cystoscopic verification of the monofilament being centered in the delivery bay. The urethral end piece was then affixed to the monofilament thereby tailoring the size of the implant. Excess filament was then severed. The delivery device was then re-advanced into the bladder. The delivery device was then replaced with cystoscope and bridge and the implant location and opening effect was confirmed cystoscopically. The same procedure was then repeated on the left side, and 2 additional implants were delivered just proximal to the verumontanum, again one on right and one on left side of the prostate, following the same technique. 1 implant on each side, in addition to the 4 above implants, did not adequately engage, more than likely due to the capsular implant hitting bone.   A final cystoscopy was conducted first to inspect the location and state of each implant and second, to confirm the presence of a continuous anterior channel was present through the prostatic urethra with irrigation flow turned off.  Four Implants were delivered in total, 2 extra implants were utilized, but did not engage adequately .Careful inspection of the bladder with the 21 [place]French scope and the 70 lens revealed no intravesical implants.  Following this, the scope was removed.Marland Kitchen  He was then awakened and taken to the PACU in  stable condition.  He tolerated the procedure well.

## 2017-01-06 NOTE — Anesthesia Procedure Notes (Signed)
Procedure Name: LMA Insertion Date/Time: 01/06/2017 9:14 AM Performed by: Maris BergerENENNY, Emary Zalar T Pre-anesthesia Checklist: Patient identified, Emergency Drugs available, Suction available and Patient being monitored Patient Re-evaluated:Patient Re-evaluated prior to induction Oxygen Delivery Method: Circle system utilized Preoxygenation: Pre-oxygenation with 100% oxygen Induction Type: IV induction Ventilation: Mask ventilation without difficulty LMA: LMA inserted LMA Size: 5.0 Number of attempts: 1 Airway Equipment and Method: Bite block Placement Confirmation: positive ETCO2 Tube secured with: Tape Dental Injury: Teeth and Oropharynx as per pre-operative assessment

## 2017-01-07 ENCOUNTER — Encounter (HOSPITAL_BASED_OUTPATIENT_CLINIC_OR_DEPARTMENT_OTHER): Payer: Self-pay | Admitting: Urology

## 2017-01-15 ENCOUNTER — Observation Stay (HOSPITAL_COMMUNITY): Payer: BC Managed Care – PPO

## 2017-01-15 ENCOUNTER — Encounter (HOSPITAL_COMMUNITY): Payer: Self-pay

## 2017-01-15 ENCOUNTER — Other Ambulatory Visit: Payer: Self-pay

## 2017-01-15 ENCOUNTER — Observation Stay (HOSPITAL_COMMUNITY)
Admission: EM | Admit: 2017-01-15 | Discharge: 2017-01-17 | Disposition: A | Discharge status: Home / Self Care | Payer: BC Managed Care – PPO | Attending: Internal Medicine | Admitting: Internal Medicine

## 2017-01-15 ENCOUNTER — Emergency Department (HOSPITAL_COMMUNITY): Payer: BC Managed Care – PPO

## 2017-01-15 DIAGNOSIS — N400 Enlarged prostate without lower urinary tract symptoms: Secondary | ICD-10-CM | POA: Diagnosis not present

## 2017-01-15 DIAGNOSIS — Z87442 Personal history of urinary calculi: Secondary | ICD-10-CM | POA: Diagnosis not present

## 2017-01-15 DIAGNOSIS — M1990 Unspecified osteoarthritis, unspecified site: Secondary | ICD-10-CM | POA: Insufficient documentation

## 2017-01-15 DIAGNOSIS — N200 Calculus of kidney: Secondary | ICD-10-CM | POA: Diagnosis not present

## 2017-01-15 DIAGNOSIS — Z79899 Other long term (current) drug therapy: Secondary | ICD-10-CM | POA: Diagnosis not present

## 2017-01-15 DIAGNOSIS — N179 Acute kidney failure, unspecified: Secondary | ICD-10-CM | POA: Diagnosis not present

## 2017-01-15 DIAGNOSIS — I959 Hypotension, unspecified: Secondary | ICD-10-CM | POA: Insufficient documentation

## 2017-01-15 DIAGNOSIS — E785 Hyperlipidemia, unspecified: Secondary | ICD-10-CM | POA: Insufficient documentation

## 2017-01-15 DIAGNOSIS — R233 Spontaneous ecchymoses: Secondary | ICD-10-CM | POA: Insufficient documentation

## 2017-01-15 DIAGNOSIS — R21 Rash and other nonspecific skin eruption: Secondary | ICD-10-CM | POA: Insufficient documentation

## 2017-01-15 DIAGNOSIS — Z23 Encounter for immunization: Secondary | ICD-10-CM | POA: Diagnosis not present

## 2017-01-15 DIAGNOSIS — Z7982 Long term (current) use of aspirin: Secondary | ICD-10-CM | POA: Diagnosis not present

## 2017-01-15 DIAGNOSIS — Z882 Allergy status to sulfonamides status: Secondary | ICD-10-CM | POA: Diagnosis not present

## 2017-01-15 DIAGNOSIS — I10 Essential (primary) hypertension: Secondary | ICD-10-CM | POA: Diagnosis not present

## 2017-01-15 LAB — URINALYSIS, ROUTINE W REFLEX MICROSCOPIC
Bilirubin Urine: NEGATIVE
Glucose, UA: NEGATIVE mg/dL
Hgb urine dipstick: NEGATIVE
Ketones, ur: NEGATIVE mg/dL
Nitrite: NEGATIVE
Protein, ur: 30 mg/dL — AB
Specific Gravity, Urine: 1.025 (ref 1.005–1.030)
pH: 5 (ref 5.0–8.0)

## 2017-01-15 LAB — CBC WITH DIFFERENTIAL/PLATELET
Basophils Absolute: 0 10*3/uL (ref 0.0–0.1)
Basophils Relative: 0 %
Eosinophils Absolute: 0.1 10*3/uL (ref 0.0–0.7)
Eosinophils Relative: 2 %
HCT: 39 % (ref 39.0–52.0)
Hemoglobin: 13.3 g/dL (ref 13.0–17.0)
Lymphocytes Relative: 2 %
Lymphs Abs: 0.1 10*3/uL — ABNORMAL LOW (ref 0.7–4.0)
MCH: 30.2 pg (ref 26.0–34.0)
MCHC: 34.1 g/dL (ref 30.0–36.0)
MCV: 88.6 fL (ref 78.0–100.0)
Monocytes Absolute: 0.2 10*3/uL (ref 0.1–1.0)
Monocytes Relative: 3 %
Neutro Abs: 4.7 10*3/uL (ref 1.7–7.7)
Neutrophils Relative %: 92 %
Platelets: 113 10*3/uL — ABNORMAL LOW (ref 150–400)
RBC: 4.4 MIL/uL (ref 4.22–5.81)
RDW: 12.7 % (ref 11.5–15.5)
WBC: 5.1 10*3/uL (ref 4.0–10.5)

## 2017-01-15 LAB — I-STAT CG4 LACTIC ACID, ED: Lactic Acid, Venous: 1.31 mmol/L (ref 0.5–1.9)

## 2017-01-15 LAB — COMPREHENSIVE METABOLIC PANEL
ALT: 61 U/L (ref 17–63)
AST: 67 U/L — ABNORMAL HIGH (ref 15–41)
Albumin: 3.5 g/dL (ref 3.5–5.0)
Alkaline Phosphatase: 55 U/L (ref 38–126)
Anion gap: 11 (ref 5–15)
BUN: 42 mg/dL — ABNORMAL HIGH (ref 6–20)
CO2: 24 mmol/L (ref 22–32)
Calcium: 8.7 mg/dL — ABNORMAL LOW (ref 8.9–10.3)
Chloride: 102 mmol/L (ref 101–111)
Creatinine, Ser: 2.06 mg/dL — ABNORMAL HIGH (ref 0.61–1.24)
GFR calc Af Amer: 37 mL/min — ABNORMAL LOW (ref >60)
GFR calc non Af Amer: 32 mL/min — ABNORMAL LOW (ref >60)
Glucose, Bld: 118 mg/dL — ABNORMAL HIGH (ref 65–99)
Potassium: 3.7 mmol/L (ref 3.5–5.1)
Sodium: 137 mmol/L (ref 135–145)
Total Bilirubin: 1.1 mg/dL (ref 0.3–1.2)
Total Protein: 6.6 g/dL (ref 6.5–8.1)

## 2017-01-15 LAB — CK: Total CK: 209 U/L (ref 49–397)

## 2017-01-15 LAB — LACTATE DEHYDROGENASE: LDH: 198 U/L — ABNORMAL HIGH (ref 98–192)

## 2017-01-15 LAB — RAPID HIV SCREEN (HIV 1/2 AB+AG)
HIV 1/2 Antibodies: NONREACTIVE
HIV-1 P24 Antigen - HIV24: NONREACTIVE

## 2017-01-15 LAB — C-REACTIVE PROTEIN: CRP: 13.4 mg/dL — ABNORMAL HIGH (ref <1.0)

## 2017-01-15 LAB — SEDIMENTATION RATE: Sed Rate: 26 mm/hr — ABNORMAL HIGH (ref 0–16)

## 2017-01-15 MED ORDER — PIPERACILLIN-TAZOBACTAM 3.375 G IVPB
3.3750 g | Freq: Three times a day (TID) | INTRAVENOUS | Status: DC
  Administered 2017-01-16 – 2017-01-17 (×5): 3.375 g via INTRAVENOUS
  Filled 2017-01-15 (×6): qty 50

## 2017-01-15 MED ORDER — SODIUM CHLORIDE 0.9 % IV BOLUS (SEPSIS)
2000.0000 mL | Freq: Once | INTRAVENOUS | Status: AC
  Administered 2017-01-15: 2000 mL via INTRAVENOUS

## 2017-01-15 MED ORDER — INFLUENZA VAC SPLIT HIGH-DOSE 0.5 ML IM SUSY
0.5000 mL | PREFILLED_SYRINGE | INTRAMUSCULAR | Status: DC
  Filled 2017-01-15: qty 0.5

## 2017-01-15 MED ORDER — SODIUM CHLORIDE 0.9 % IV SOLN
INTRAVENOUS | Status: DC
  Administered 2017-01-15 – 2017-01-17 (×3): via INTRAVENOUS

## 2017-01-15 MED ORDER — ENSURE ENLIVE PO LIQD
237.0000 mL | Freq: Two times a day (BID) | ORAL | Status: DC
  Administered 2017-01-16 – 2017-01-17 (×2): 237 mL via ORAL

## 2017-01-15 MED ORDER — SERTRALINE HCL 50 MG PO TABS
50.0000 mg | ORAL_TABLET | Freq: Every day | ORAL | Status: DC
  Administered 2017-01-15: 50 mg via ORAL
  Filled 2017-01-15 (×3): qty 1

## 2017-01-15 MED ORDER — SODIUM CHLORIDE 0.9 % IV SOLN: 1250.0000 mg | INTRAVENOUS | Status: DC

## 2017-01-15 MED ORDER — TAMSULOSIN HCL 0.4 MG PO CAPS
0.4000 mg | ORAL_CAPSULE | Freq: Every day | ORAL | Status: DC
  Administered 2017-01-16: 0.4 mg via ORAL
  Filled 2017-01-15: qty 1

## 2017-01-15 MED ORDER — SODIUM CHLORIDE 0.9 % IV BOLUS (SEPSIS)
1000.0000 mL | Freq: Once | INTRAVENOUS | Status: AC
  Administered 2017-01-15: 1000 mL via INTRAVENOUS

## 2017-01-15 MED ORDER — PIPERACILLIN-TAZOBACTAM 3.375 G IVPB 30 MIN
3.3750 g | Freq: Once | INTRAVENOUS | Status: AC
  Administered 2017-01-15: 3.375 g via INTRAVENOUS
  Filled 2017-01-15: qty 50

## 2017-01-15 MED ORDER — VANCOMYCIN HCL IN DEXTROSE 1-5 GM/200ML-% IV SOLN
1000.0000 mg | Freq: Once | INTRAVENOUS | Status: AC
  Administered 2017-01-15: 1000 mg via INTRAVENOUS
  Filled 2017-01-15: qty 200

## 2017-01-15 NOTE — Progress Notes (Signed)
Pharmacy Antibiotic Note  Ricardo Little is a 66 y.o. male with PMH BPH, urolithiasis, HTN, HLD, s/p recent cystoscopy with insertion of urolift mesh and cystolithalopaxy, admitted on 01/15/2017 with sepsis.  Pharmacy has been consulted for vancomycin and Zosyn dosing.  Plan:  Vancomycin 1000 mg IV now, then 1250 mg IV q36 hr for est AUC 449 w/ SCr 2.06 (Goal AUC 400-500 hr*mcg/mL)  Measure vancomycin AUC at steady state as indicated  Zosyn 3.375 g IV given once over 30 minutes, then every 8 hrs by 4-hr infusion    Height: 5\' 9"  (175.3 cm) Weight: 190 lb (86.2 kg) IBW/kg (Calculated) : 70.7  Temp (24hrs), Avg:97.5 F (36.4 C), Min:97.5 F (36.4 C), Max:97.5 F (36.4 C)  Recent Labs  Lab 01/15/17 1546 01/15/17 1602  WBC 5.1  --   CREATININE 2.06*  --   LATICACIDVEN  --  1.31    Estimated Creatinine Clearance: 38.4 mL/min (A) (by C-G formula based on SCr of 2.06 mg/dL (H)).    Allergies  Allergen Reactions  . Bactrim [Sulfamethoxazole-Trimethoprim] Rash    Antimicrobials this admission: 11/7 Vanc >>  11/7 Zosyn >>   Dose adjustments this admission: ---  Microbiology results: 11/7 BCx: sent   Thank you for allowing pharmacy to be a part of this patient's care.  Bernadene Personrew Abelardo Seidner, PharmD, BCPS Pager: [phone] 01/15/2017, 8:28 PM

## 2017-01-15 NOTE — ED Notes (Signed)
ED TO INPATIENT HANDOFF REPORT  Name/Age/Gender Ricardo Little 66 y.o. male  Code Status Code Status History    Date Active Date Inactive Code Status Order ID Comments User Context   05/01/2013 20:09 05/02/2013 13:00 Full Code [id number]  Linna Hoff, MD Inpatient      Home/SNF/Other home  Chief Complaint sepsis   Level of Care/Admitting Diagnosis ED Disposition    ED Disposition Condition [place]: [facility] [[id number]]  Level of Care: Med-Surg [16]  Diagnosis: Acute renal failure (ARF) ([place]) [829937]  Admitting Physician: Hosie Poisson [[id number]]  Attending Physician: Hosie Poisson [[id number]]  PT Class (Do Not Modify): Observation [104]  PT Acc Code (Do Not Modify): Observation [10022]       Medical History Past Medical History:  Diagnosis Date  . Arthritis    KNEE  . BPH with urinary obstruction   . History of benign thyroid tumor   . History of kidney stones   . Hyperlipidemia   . Hypertension   . Nephrolithiasis   . Self-catheterizes urinary bladder   . Urinary retention     Allergies Allergies  Allergen Reactions  . Bactrim [Sulfamethoxazole-Trimethoprim] Rash    IV Location/Drains/Wounds Patient Lines/Drains/Airways Status   Active Line/Drains/Airways    Name:   Placement date:   Placement time:   Site:   Days:   Peripheral IV 09/05/12 Left Antecubital   09/05/12    1120    Antecubital   1593   Peripheral IV Right Hand   -    1658    Hand      Peripheral IV 01/15/17 Left Antecubital   01/15/17    1551    Antecubital   less than 1   Peripheral IV 01/15/17 Right Antecubital   01/15/17    1756    Antecubital   less than 1   Urethral Catheter Latex 18 Fr.   08/27/12    1621    Latex   1602   Ureteral Drain/Stent Right ureter 6 Fr.   08/27/12    1520    Right ureter   1602   Ureteral Drain/Stent Left ureter 6 Fr.   08/27/12    1619    Left ureter   1602   Ureteral Drain/Stent Right ureter 6 Fr.   07/21/14    0911     Right ureter   909   Airway   01/06/17    0914     9   Incision 08/27/12 Perineum Other (Comment)   08/27/12    1533     1602   Incision (Closed) 05/01/13 Arm Left   05/01/13    1842     1355   Incision (Closed) 07/21/14 Penis Other (Comment)   07/21/14    0913     909   Incision (Closed) 01/06/17 Penis   01/06/17    0910     9          Labs/Imaging Results for orders placed or performed during the hospital encounter of 01/15/17 (from the past 48 hour(s))  Comprehensive metabolic panel     Status: Abnormal   Collection Time: 01/15/17  3:46 PM  Result Value Ref Range   Sodium 137 135 - 145 mmol/L   Potassium 3.7 3.5 - 5.1 mmol/L   Chloride 102 101 - 111 mmol/L   CO2 24 22 - 32 mmol/L   Glucose, Bld 118 (H) 65 - 99 mg/dL  BUN 42 (H) 6 - 20 mg/dL   Creatinine, Ser 2.06 (H) 0.61 - 1.24 mg/dL   Calcium 8.7 (L) 8.9 - 10.3 mg/dL   Total Protein 6.6 6.5 - 8.1 g/dL   Albumin 3.5 3.5 - 5.0 g/dL   AST 67 (H) 15 - 41 U/L   ALT 61 17 - 63 U/L   Alkaline Phosphatase 55 38 - 126 U/L   Total Bilirubin 1.1 0.3 - 1.2 mg/dL   GFR calc non Af Amer 32 (L) >60 mL/min   GFR calc Af Amer 37 (L) >60 mL/min    Comment: (NOTE) The eGFR has been calculated using the CKD EPI equation. This calculation has not been validated in all clinical situations. eGFR's persistently <60 mL/min signify possible Chronic Kidney Disease.    Anion gap 11 5 - 15  CBC with Differential     Status: Abnormal   Collection Time: 01/15/17  3:46 PM  Result Value Ref Range   WBC 5.1 4.0 - 10.5 K/uL   RBC 4.40 4.22 - 5.81 MIL/uL   Hemoglobin 13.3 13.0 - 17.0 g/dL   HCT 39.0 39.0 - 52.0 %   MCV 88.6 78.0 - 100.0 fL   MCH 30.2 26.0 - 34.0 pg   MCHC 34.1 30.0 - 36.0 g/dL   RDW 12.7 11.5 - 15.5 %   Platelets 113 (L) 150 - 400 K/uL    Comment: REPEATED TO VERIFY SPECIMEN CHECKED FOR CLOTS PLATELET COUNT CONFIRMED BY SMEAR    Neutrophils Relative % 92 %   Neutro Abs 4.7 1.7 - 7.7 K/uL   Lymphocytes Relative 2 %    Lymphs Abs 0.1 (L) 0.7 - 4.0 K/uL   Monocytes Relative 3 %   Monocytes Absolute 0.2 0.1 - 1.0 K/uL   Eosinophils Relative 2 %   Eosinophils Absolute 0.1 0.0 - 0.7 K/uL   Basophils Relative 0 %   Basophils Absolute 0.0 0.0 - 0.1 K/uL  I-Stat CG4 Lactic Acid, ED     Status: None   Collection Time: 01/15/17  4:02 PM  Result Value Ref Range   Lactic Acid, Venous 1.31 0.5 - 1.9 mmol/L  Sedimentation rate     Status: Abnormal   Collection Time: 01/15/17  5:28 PM  Result Value Ref Range   Sed Rate 26 (H) 0 - 16 mm/hr  Rapid HIV screen (HIV 1/2 Ab+Ag)     Status: None   Collection Time: 01/15/17  5:28 PM  Result Value Ref Range   HIV-1 P24 Antigen - HIV24 NON REACTIVE NON REACTIVE   HIV 1/2 Antibodies NON REACTIVE NON REACTIVE   Interpretation (HIV Ag Ab)      A non reactive test result means that HIV 1 or HIV 2 antibodies and HIV 1 p24 antigen were not detected in the specimen.    Comment: RESULT CALLED TO, READ BACK BY AND VERIFIED WITH: TIM SMITH,RN [id number] @ 1850 BY J SCOTTON   Lactate dehydrogenase     Status: Abnormal   Collection Time: 01/15/17  5:28 PM  Result Value Ref Range   LDH 198 (H) 98 - 192 U/L  CK     Status: None   Collection Time: 01/15/17  5:28 PM  Result Value Ref Range   Total CK 209 49 - 397 U/L  Urinalysis, Routine w reflex microscopic     Status: Abnormal   Collection Time: 01/15/17  5:41 PM  Result Value Ref Range   Color, Urine AMBER (A) YELLOW    Comment:  BIOCHEMICALS MAY BE AFFECTED BY COLOR   APPearance HAZY (A) CLEAR   Specific Gravity, Urine 1.025 1.005 - 1.030   pH 5.0 5.0 - 8.0   Glucose, UA NEGATIVE NEGATIVE mg/dL   Hgb urine dipstick NEGATIVE NEGATIVE   Bilirubin Urine NEGATIVE NEGATIVE   Ketones, ur NEGATIVE NEGATIVE mg/dL   Protein, ur 30 (A) NEGATIVE mg/dL   Nitrite NEGATIVE NEGATIVE   Leukocytes, UA LARGE (A) NEGATIVE   RBC / HPF 0-5 0 - 5 RBC/hpf   WBC, UA TOO NUMEROUS TO COUNT 0 - 5 WBC/hpf   Bacteria, UA RARE (A) NONE SEEN    Squamous Epithelial / LPF 0-5 (A) NONE SEEN   WBC Clumps PRESENT    Mucus PRESENT    Hyaline Casts, UA PRESENT    Dg Chest 2 View  Result Date: 01/15/2017 CLINICAL DATA:  Night sweats, weight loss and fever. EXAM: CHEST  2 VIEW COMPARISON:  01/15/2017 and prior radiographs FINDINGS: The cardiomediastinal silhouette is unremarkable. There is no evidence of focal airspace disease, pulmonary edema, suspicious pulmonary nodule/mass, pleural effusion, or pneumothorax. No acute bony abnormalities are identified. IMPRESSION: No active cardiopulmonary disease. Electronically Signed   By: Margarette Canada M.D.   On: 01/15/2017 17:35    Pending Labs Unresulted Labs (From admission, onward)   Start     Ordered   01/16/17 0500  CBC  Tomorrow morning,   R     01/15/17 1753   01/16/17 0500  Comprehensive metabolic panel  Tomorrow morning,   R     01/15/17 1753   01/15/17 1642  Rheumatoid factor  Once,   R     01/15/17 1641   01/15/17 1642  Antinuclear Antibodies, IFA  Once,   R     01/15/17 1641   01/15/17 1632  C-reactive protein  Once,   STAT     01/15/17 1637   01/15/17 1632  Blood culture (routine x 2)  BLOOD CULTURE X 2,   STAT     01/15/17 1637   Signed and Held  Creatinine, serum  (enoxaparin (LOVENOX)    CrCl < 30 ml/min)  Weekly,   R    Comments:  while on enoxaparin therapy.    Signed and Held      Vitals/Pain Today's Vitals   01/15/17 1521 01/15/17 1536 01/15/17 1735 01/15/17 1906  BP: (!) 93/59  93/65 (!) 81/50  Pulse: 79  80 87  Resp: 16   16  Temp: (!) 97.5 F (36.4 C)     TempSrc: Oral     SpO2: 98%  100% 99%  Weight:  190 lb (86.2 kg)    Height:  5' 9"  (1.753 m)      Isolation Precautions No active isolations  Medications Medications  sodium chloride 0.9 % bolus 2,000 mL (2,000 mLs Intravenous New Bag/Given 01/15/17 1743)    Mobility walks

## 2017-01-15 NOTE — ED Notes (Signed)
Please call report to [facility] at [phone] at 1915. Nino Parsleyark, Kalon Erhardt B

## 2017-01-15 NOTE — ED Provider Notes (Signed)
[facility]-EMERGENCY DEPT Provider Note   CSN: [id number] Arrival date & time: 01/15/17  1514     History   Chief Complaint Chief Complaint  Patient presents with  . low blood pressure  . Back Pain    HPI Ricardo Little is a 66 y.o. male.  HPI   66yM with night sweats, anorexia, generalized fatigue and 17 lbs unintentional weight loss. He had a urolift procedure by urology last week. He reports that he was having these symptoms at least a week prior to this though. Took 5 days of bactrim prophylacticaly prior to this procedure. He reports that   Past Medical History:  Diagnosis Date  . Arthritis    KNEE  . BPH with urinary obstruction   . History of benign thyroid tumor   . History of kidney stones   . Hyperlipidemia   . Hypertension   . Nephrolithiasis   . Self-catheterizes urinary bladder   . Urinary retention     Patient Active Problem List   Diagnosis Date Noted  . Distal radius fracture, left 05/01/2013  . Hypertension 09/05/2012  . Hyperlipidemia 09/05/2012    Past Surgical History:  Procedure Laterality Date  . KNEE ARTHROSCOPY W/ MENISCECTOMY  JUNE 2008  . 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 Authorizing Provider  acetaminophen (TYLENOL) 500 MG tablet Take 1,000 mg by mouth daily as needed for mild pain.    [provider]  amLODipine-benazepril (LOTREL) 5-20 MG per capsule Take 1 capsule by mouth every morning.     [provider]  aspirin EC 81 MG tablet Take 81 mg by mouth daily.     [provider]  atorvastatin (LIPITOR) 10 MG tablet Take 5 mg by mouth every morning.     [provider]  cephALEXin (KEFLEX) 500 MG capsule Take 1 capsule (500 mg total) by mouth 2 (two) times daily. 01/06/17   Marcine Matar, MD  fish oil-omega-3 fatty acids 1000 MG capsule Take  2 g by mouth daily.    [provider]  Flaxseed, Linseed, (FLAX SEED OIL) 1000 MG CAPS Take 1 capsule by mouth daily.    [provider]  Magnesium 200 MG TABS Take 1 tablet by mouth daily.    [provider]  Multiple Vitamin (MULTIVITAMIN WITH MINERALS) TABS Take 1 tablet by mouth daily.    [provider]  sertraline (ZOLOFT) 50 MG tablet Take 25 mg by mouth every morning.     [provider]  tamsulosin (FLOMAX) 0.4 MG CAPS capsule Take 0.4 mg by mouth 2 (two) times daily.    [provider]  traMADol (ULTRAM) 50 MG tablet Take 1 tablet (50 mg total) by mouth every 6 (six) hours as needed. 01/06/17   Marcine Matar, MD    Family History Family History  Problem Relation Age of Onset  . Cancer Mother        Ovarian  . Cancer Father        Liver  . Heart disease Father     Social History Social History   Tobacco Use  . Smoking status: Never Smoker  . Smokeless tobacco: Never Used  Substance Use Topics  . Alcohol use: Yes    Comment: RARE  . Drug use: No     Allergies   Patient has  no allergy information on record.   Review of Systems Review of Systems  All systems reviewed and negative, other than as noted in HPI.  Physical Exam Updated Vital Signs BP (!) 93/59   Pulse 79   Temp (!) 97.5 F (36.4 C) (Oral)   Resp 16   Ht 5\' 9"  (1.753 m)   Wt 86.2 kg (190 lb)   SpO2 98%   BMI 28.06 kg/m   Physical Exam  Constitutional: He appears well-developed and well-nourished. No distress.  HENT:  Head: Normocephalic and atraumatic.  Eyes: Conjunctivae are normal. Right eye exhibits no discharge. Left eye exhibits no discharge.  Neck: Neck supple.  Cardiovascular: Normal rate, regular rhythm and normal heart sounds. Exam reveals no gallop and no friction rub.  No murmur heard. Pulmonary/Chest: Effort normal and breath sounds normal. No respiratory distress.  Abdominal: Soft. He exhibits no distension. There  is no tenderness.  Musculoskeletal: He exhibits no edema or tenderness.  Neurological: He is alert.  Skin: Skin is warm and dry.  Equal rash to lower extremities.  Nonpalpable.  Blotchy erythematous rash to trunk.  Psychiatric: He has a normal mood and affect. His behavior is normal. Thought content normal.  Nursing note and vitals reviewed.    ED Treatments / Results  Labs (all labs ordered are listed, but only abnormal results are displayed) Labs Reviewed  COMPREHENSIVE METABOLIC PANEL - Abnormal; Notable for the following components:      Result Value   Glucose, Bld 118 (*)    BUN 42 (*)    Creatinine, Ser 2.06 (*)    Calcium 8.7 (*)    AST 67 (*)    GFR calc non Af Amer 32 (*)    GFR calc Af Amer 37 (*)    All other components within normal limits  CBC WITH DIFFERENTIAL/PLATELET - Abnormal; Notable for the following components:   Platelets 113 (*)    Lymphs Abs 0.1 (*)    All other components within normal limits  CULTURE, BLOOD (ROUTINE X 2)  CULTURE, BLOOD (ROUTINE X 2)  URINALYSIS, ROUTINE W REFLEX MICROSCOPIC  SEDIMENTATION RATE  C-REACTIVE PROTEIN  RAPID HIV SCREEN (HIV 1/2 AB+AG)  RHEUMATOID FACTOR  LACTATE DEHYDROGENASE  CK  ANTINUCLEAR ANTIBODIES, IFA  I-STAT CG4 LACTIC ACID, ED    EKG  EKG Interpretation None       Radiology No results found.  Procedures Procedures (including critical care time)  Medications Ordered in ED Medications  sodium chloride 0.9 % bolus 2,000 mL (not administered)     Initial Impression / Assessment and Plan / ED Course  I have reviewed the triage vital signs and the nursing notes.  Pertinent labs & imaging results that were available during my care of the patient were reviewed by me and considered in my medical decision making (see chart for details).     [id number] with what sounds like FUO. Afebrile in ED but documented temperature of 100.5 on 10/29 and reports fevers to 101 at home.  Complicated by several  different classes of antibiotics over the past couple weeks. Differential wide (infectious, malignancy, automimmune).   Fairly nonspecific symptoms (general malaise, night sweats, anorexia, weight loss, etc). Does have some lower back pain but this seems rather mild. No hx of back surgery. Denies hx of IVDU. I didn't appreciate a heart murmur. Not much in terms or myalgias/arthralgia otherwise. No significant adenopathy. Denies significant travel history, known malignancy, incarceration, smoking history or high risk sexual behavior.  In  past day has developed rash that is petechial on LE but more reticular on trunk and upper extremities with some macular lesions.   Will obtain screening labs  Final Clinical Impressions(s) / ED Diagnoses   Final diagnoses:  ARF (acute renal failure) [facility](HCC)    ED Discharge Orders    None       Raeford RazorKohut, Peniel Biel, MD 01/21/17 1313

## 2017-01-15 NOTE — ED Triage Notes (Addendum)
Pt sent here today from [facility]. Pt recently had urologic procedures preformed and is on his third abx since Oct 24th. Pt is hypotensive, afebrile. Pt states he has had a fever of over 100 the past few nights. Pt has been taken tylenol and ibuprofen consistently.  C/o lower back pain. Denies shortness of breath, chest pain, or N/V/D. Generalized rash noted to pt's face arms and abdomen.

## 2017-01-15 NOTE — ED Triage Notes (Signed)
Dr. Hyacinth MeekerMiller and his nurse phone from [facility] at this time to tell us they are getting ready to send Ricardo Little to us. He has had two recent urologic procedures performed by Dr. Retta Dionesahlstedt, and is on his third antibiotic after same. Today pt. Is hypotensive and having rigors, although his temp. At their office is 97.8.

## 2017-01-15 NOTE — H&P (Signed)
History and Physical    Ricardo Little ZOX:[id number] DOB: Feb 11, 1951 DOA: 01/15/2017  PCP: Merlene LaughterStoneking, Hal, MD Patient coming from: Home  I have personally briefly reviewed patient[facility]  Chief Complaint: fever and chills.   HPI: Ricardo Little is a 66 y.o. male with medical history significant of  BPH, urolithiasis, hypertension, hyperlipidemia, s/p recent procedure, cystoscopy with a urolift and cystolithalopaxy on 10/29, has been on keflex and bactrim, comes in for persistent fevers and chills. He was seen in [facility] primary office and was transferred to ED FOR evaluation of sepsis. On arrival to ED, he was afebrile, has chills, hypotensive with bp 80/50's,. Labs reveal acute renal failure, normal WBC, count, and normal lactic acid. Platelet count is 113. Urine looks abnormal with large leukocytes and rare bacteria and numerous wbc clumps. He reports genralized body aches, denies abdominal pain. He reports nausea, and vomiting on Monday, none today. No diarrhea. He reports weight loss over the last 4 weeks, from decreased po intake. He denies any hematuria, hemoptysis . No sob, cough or chest pain.  No dizziness. He was referred to medical service for admission.   Review of Systems: As per HPI otherwise 10 point review of systems negative.    Past Medical History:  Diagnosis Date  . Arthritis    KNEE  . BPH with urinary obstruction   . History of benign thyroid tumor   . History of kidney stones   . Hyperlipidemia   . Hypertension   . Nephrolithiasis   . Self-catheterizes urinary bladder   . Urinary retention     Past Surgical History:  Procedure Laterality Date  . KNEE ARTHROSCOPY W/ MENISCECTOMY  JUNE 2008  . LEFT THYROID LOBECTOMY  12-31-2005   HURTHLE CELL TUMOR     reports that  has never smoked. he has never used smokeless tobacco. He reports that he drinks alcohol. He reports that he does not use drugs.  Allergies  Allergen  Reactions  . Bactrim [Sulfamethoxazole-Trimethoprim] Rash    Family History  Problem Relation Age of Onset  . Cancer Mother        Ovarian  . Cancer Father        Liver  . Heart disease Father    Reviewed. Not pertinent.   Prior to Admission medications   Medication Sig Start Date End Date Taking? Authorizing Provider  acetaminophen (TYLENOL) 500 MG tablet Take 1,000 mg by mouth daily as needed for mild pain.   Yes [provider]  ALPRAZolam (XANAX) 0.25 MG tablet Take 0.25 mg 2 (two) times daily as needed by mouth for anxiety or sleep.  07/13/15  Yes [provider]  amLODipine-benazepril (LOTREL) 5-20 MG per capsule Take 1 capsule by mouth every morning.    Yes [provider]  aspirin EC 81 MG tablet Take 81 mg by mouth daily.    Yes [provider]  atorvastatin (LIPITOR) 10 MG tablet Take 5 mg by mouth every morning.    Yes [provider]  DOCOSAHEXAENOIC ACID PO Take 1 capsule daily by mouth.    Yes [provider]  fish oil-omega-3 fatty acids 1000 MG capsule Take 2 g by mouth daily.   Yes [provider]  Flaxseed, Linseed, (FLAX SEED OIL) 1000 MG CAPS Take 1 capsule by mouth daily.   Yes [provider]  Magnesium 200 MG TABS Take 1 tablet by mouth daily.   Yes [provider]  Multiple  Vitamin (MULTIVITAMIN WITH MINERALS) TABS Take 1 tablet by mouth daily.   Yes [provider]  sertraline (ZOLOFT) 50 MG tablet Take 25 mg by mouth every morning.    Yes [provider]  sulfamethoxazole-trimethoprim (BACTRIM DS,SEPTRA DS) 800-160 MG tablet Take 1 tablet every 12 (twelve) hours by mouth. 01/13/17  Yes [provider]  tamsulosin (FLOMAX) 0.4 MG CAPS capsule Take 0.4 mg by mouth 2 (two) times daily.   Yes [provider]  amLODipine-benazepril (LOTREL) 5-20 MG capsule TAKE 1 CAPSULE BY MOUTH EVERY DAY NEEDS OFFICE VISIT 09/06/16   [provider]  aspirin EC  81 MG tablet Take by mouth.    [provider]  atorvastatin (LIPITOR) 40 MG tablet TAKE 1 TABLET EVERY DAY 01/06/16   [provider]  cephALEXin (KEFLEX) 500 MG capsule Take 1 capsule (500 mg total) by mouth 2 (two) times daily. Patient not taking: Reported on 01/15/2017 01/06/17   Marcine Matar, MD  Flaxseed, Linseed, (FLAXSEED OIL) 1000 MG CAPS Take by mouth.    [provider]  MAGNESIUM PO Take by mouth.    [provider]  Multiple Vitamin (MULTI-VITAMINS) TABS Take by mouth.    [provider]  sertraline (ZOLOFT) 50 MG tablet TAKE 1 TABLET BY MOUTH EVERY DAY 03/25/15   [provider]  tamsulosin (FLOMAX) 0.4 MG CAPS capsule TAKE ONE CAPSULE BY MOUTH EVERY NIGHT (BEDTIME) 05/21/16   [provider]  traMADol (ULTRAM) 50 MG tablet Take 1 tablet (50 mg total) by mouth every 6 (six) hours as needed. Patient taking differently: Take 50 mg every 6 (six) hours as needed by mouth for moderate pain.  01/06/17   Marcine Matar, MD    Physical Exam: Vitals:   01/15/17 1521 01/15/17 1536 01/15/17 1735  BP: (!) 93/59  93/65  Pulse: 79  80  Resp: 16    Temp: (!) 97.5 F (36.4 C)    TempSrc: Oral    SpO2: 98%  100%  Weight:  86.2 kg (190 lb)   Height:  5\' 9"  (1.753 m)     Constitutional: NAD, calm, comfortable Vitals:   01/15/17 1521 01/15/17 1536 01/15/17 1735  BP: (!) 93/59  93/65  Pulse: 79  80  Resp: 16    Temp: (!) 97.5 F (36.4 C)    TempSrc: Oral    SpO2: 98%  100%  Weight:  86.2 kg (190 lb)   Height:  5\' 9"  (1.753 m)    Eyes: PERRL, lids and conjunctivae normal ENMT: Mucous membranes are moist. Posterior pharynx clear of any exudate or lesions.Normal dentition.  Neck: normal, supple, no masses, no thyromegaly Respiratory: clear to auscultation bilaterally, no wheezing, no crackles. Normal respiratory effort. No accessory muscle use.  Cardiovascular: Regular rate and rhythm, no murmurs / rubs / gallops.  No extremity edema. 2+ pedal pulses. No carotid bruits.  Abdomen: no tenderness, no masses palpated. No hepatosplenomegaly. Bowel sounds positive.  Musculoskeletal: no clubbing / cyanosis. No joint deformity upper and lower extremities. Good ROM, no contractures. Normal muscle tone.  Skin: blanchable rash on the nose and on the cheeks, petechiae on the legs, chest.  Neurologic: CN 2-12 grossly intact. Sensation intact, DTR normal. Strength 5/5 in all 4.  Psychiatric: Normal judgment and insight. Alert and oriented x 3. Normal mood.     Labs on Admission: I have personally reviewed following labs and imaging studies  CBC: Recent Labs  Lab 01/15/17 1546  WBC 5.1  NEUTROABS 4.7  HGB 13.3  HCT 39.0  MCV 88.6  PLT 113*   Basic Metabolic Panel: Recent Labs  Lab 01/15/17 1546  NA 137  K 3.7  CL 102  CO2 24  GLUCOSE 118*  BUN 42*  CREATININE 2.06*  CALCIUM 8.7*   GFR: Estimated Creatinine Clearance: 38.4 mL/min (A) (by C-G formula based on SCr of 2.06 mg/dL (H)). Liver Function Tests: Recent Labs  Lab 01/15/17 1546  AST 67*  ALT 61  ALKPHOS 55  BILITOT 1.1  PROT 6.6  ALBUMIN 3.5   No results for input(s): LIPASE, AMYLASE in the last 168 hours. No results for input(s): AMMONIA in the last 168 hours. Coagulation Profile: No results for input(s): INR, PROTIME in the last 168 hours. Cardiac Enzymes: No results for input(s): CKTOTAL, CKMB, CKMBINDEX, TROPONINI in the last 168 hours. BNP (last 3 results) No results for input(s): PROBNP in the last 8760 hours. HbA1C: No results for input(s): HGBA1C in the last 72 hours. CBG: No results for input(s): GLUCAP in the last 168 hours. Lipid Profile: No results for input(s): CHOL, HDL, LDLCALC, TRIG, CHOLHDL, LDLDIRECT in the last 72 hours. Thyroid Function Tests: No results for input(s): TSH, T4TOTAL, FREET4, T3FREE, THYROIDAB in the last 72 hours. Anemia Panel: No results for input(s): VITAMINB12, FOLATE, FERRITIN, TIBC,  IRON, RETICCTPCT in the last 72 hours. Urine analysis:    Component Value Date/Time   COLORURINE AMBER (A) 01/15/2017 1741   APPEARANCEUR HAZY (A) 01/15/2017 1741   LABSPEC 1.025 01/15/2017 1741   PHURINE 5.0 01/15/2017 1741   GLUCOSEU NEGATIVE 01/15/2017 1741   HGBUR NEGATIVE 01/15/2017 1741   BILIRUBINUR NEGATIVE 01/15/2017 1741   BILIRUBINUR neg 09/05/2012 1140   KETONESUR NEGATIVE 01/15/2017 1741   PROTEINUR 30 (A) 01/15/2017 1741   UROBILINOGEN 0.2 09/05/2012 1140   UROBILINOGEN 0.2 08/20/2012 2040   NITRITE NEGATIVE 01/15/2017 1741   LEUKOCYTESUR LARGE (A) 01/15/2017 1741    Radiological Exams on Admission: Dg Chest 2 View  Result Date: 01/15/2017 CLINICAL DATA:  Night sweats, weight loss and fever. EXAM: CHEST  2 VIEW COMPARISON:  01/15/2017 and prior radiographs FINDINGS: The cardiomediastinal silhouette is unremarkable. There is no evidence of focal airspace disease, pulmonary edema, suspicious pulmonary nodule/mass, pleural effusion, or pneumothorax. No acute bony abnormalities are identified. IMPRESSION: No active cardiopulmonary disease. Electronically Signed   By: Harmon Pier M.D.   On: 01/15/2017 17:35    EKG: not done.   Assessment/Plan Active Problems:   Acute renal failure (ARF) (HCC)     Acute renal failure:  Suspect from decreased po intake/ pre renal.  Get US renal to rule out obstructive causes.  Hydrate and repeat renal parameters.     H/o Urolithiasis s/p recent cystoscopy, s/p urolift and now comes in for ARF, fever and chills.  - will call urology Dr Lynnae Sandhoff in am for recommendations.    Fever , chills : / SIRS Hypotensive, though lactic acid is normal.  Repeat lactic acid in am.  Unclear etiology., ? Possibly UTI, URINE cultures sent. Blood cultures done and pending.  Empiric antibiotics with vancomycin ( because of recent procedure) and zosyn. Narrow to simpler antibiotics in the next 24 to 48 hours.  CXR does not show pneumonia.     Rash on the face :  Blanchable, non itchy, suspect from the bactrim , vs keflex.  Pt reports possibly from bactrim, used it last.    Petechiae possibly from thrombocytopenia which again could be from medications. ? Antibiotics.  Monitor platelets.  Non itchy, non blanchable.    Hypotension:  Fluid bolus and maintenance fluids at 15900ml/hr.  Possibly from SIRS.  Holding all bp meds.  Pt asymptomatic.      DVT prophylaxis: SCD'S Code Status: FULL CODE Family Communication: NONE AT BEDSIDE.  Disposition Plan: pending evaluation of ARF.  Consults called:  Will call urology in am.  Admission status: obs med [place] Jazmene Racz MD [facility] Pager [phone]  If 7PM-7AM, please contact night-coverage [email or web address] Password [email or web address]  01/15/2017, 6:23 PM

## 2017-01-15 NOTE — ED Notes (Signed)
Date and time results received: 01/15/17 1851 (use smartphrase ".now" to insert current time)  Test: Rapid HIV Critical Value: Non-reactive  Name of Provider Notified:   Orders Received? Or Actions Taken?:

## 2017-01-15 NOTE — ED Notes (Signed)
Bed: WA04 Expected date:  Expected time:  Means of arrival:  Comments: [facility] pt.

## 2017-01-16 DIAGNOSIS — I10 Essential (primary) hypertension: Secondary | ICD-10-CM | POA: Diagnosis not present

## 2017-01-16 DIAGNOSIS — N179 Acute kidney failure, unspecified: Secondary | ICD-10-CM | POA: Diagnosis not present

## 2017-01-16 LAB — CBC
HEMATOCRIT: 34.2 % — AB (ref 39.0–52.0)
Hemoglobin: 11.5 g/dL — ABNORMAL LOW (ref 13.0–17.0)
MCH: 29.6 pg (ref 26.0–34.0)
MCHC: 33.6 g/dL (ref 30.0–36.0)
MCV: 88.1 fL (ref 78.0–100.0)
PLATELETS: 103 10*3/uL — AB (ref 150–400)
RBC: 3.88 MIL/uL — ABNORMAL LOW (ref 4.22–5.81)
RDW: 12.8 % (ref 11.5–15.5)
WBC: 3.4 10*3/uL — AB (ref 4.0–10.5)

## 2017-01-16 LAB — COMPREHENSIVE METABOLIC PANEL
ALBUMIN: 2.8 g/dL — AB (ref 3.5–5.0)
ALT: 59 U/L (ref 17–63)
ANION GAP: 7 (ref 5–15)
AST: 62 U/L — AB (ref 15–41)
Alkaline Phosphatase: 50 U/L (ref 38–126)
BILIRUBIN TOTAL: 1.1 mg/dL (ref 0.3–1.2)
BUN: 34 mg/dL — AB (ref 6–20)
CHLORIDE: 110 mmol/L (ref 101–111)
CO2: 21 mmol/L — AB (ref 22–32)
Calcium: 8 mg/dL — ABNORMAL LOW (ref 8.9–10.3)
Creatinine, Ser: 1.43 mg/dL — ABNORMAL HIGH (ref 0.61–1.24)
GFR calc Af Amer: 57 mL/min — ABNORMAL LOW (ref >60)
GFR calc non Af Amer: 50 mL/min — ABNORMAL LOW (ref >60)
GLUCOSE: 111 mg/dL — AB (ref 65–99)
POTASSIUM: 3.8 mmol/L (ref 3.5–5.1)
SODIUM: 138 mmol/L (ref 135–145)
Total Protein: 5.5 g/dL — ABNORMAL LOW (ref 6.5–8.1)

## 2017-01-16 LAB — ANTINUCLEAR ANTIBODIES, IFA: ANA Ab, IFA: NEGATIVE

## 2017-01-16 LAB — RHEUMATOID FACTOR: Rhuematoid fact SerPl-aCnc: 20.4 IU/mL — ABNORMAL HIGH (ref 0.0–13.9)

## 2017-01-16 MED ORDER — INFLUENZA VAC SPLIT HIGH-DOSE 0.5 ML IM SUSY
0.5000 mL | PREFILLED_SYRINGE | INTRAMUSCULAR | Status: AC
  Administered 2017-01-17: 0.5 mL via INTRAMUSCULAR
  Filled 2017-01-16: qty 0.5

## 2017-01-16 MED ORDER — ACETAMINOPHEN 325 MG PO TABS
650.0000 mg | ORAL_TABLET | Freq: Once | ORAL | Status: AC
  Administered 2017-01-16: 650 mg via ORAL
  Filled 2017-01-16: qty 2

## 2017-01-16 MED ORDER — SODIUM CHLORIDE 0.9 % IV SOLN
1250.0000 mg | INTRAVENOUS | Status: DC
  Administered 2017-01-16: 1250 mg via INTRAVENOUS
  Filled 2017-01-16 (×2): qty 1250

## 2017-01-16 MED ORDER — FAMOTIDINE 20 MG PO TABS
20.0000 mg | ORAL_TABLET | Freq: Once | ORAL | Status: AC
  Administered 2017-01-16: 20 mg via ORAL
  Filled 2017-01-16: qty 1

## 2017-01-16 NOTE — Progress Notes (Signed)
PROGRESS NOTE    Ricardo Little  ZOX:[id number] DOB: 03/08/1951 DOA: 01/15/2017 PCP: Merlene LaughterStoneking, Hal, MD )   Brief Narrative: Ricardo Little is a 66 y.o. male with medical history significant of  BPH, urolithiasis, hypertension, hyperlipidemia, s/p recent procedure, cystoscopy with a urolift and cystolithalopaxy on 10/29, has been on keflex and bactrim, comes in for persistent fevers and chills.    Assessment & Plan:   Active Problems:   Acute renal failure (ARF) (HCC)  SIRS/ : Hypotension improved.  Afebrile, no leukocytosis.  Possibly viral vs UTI.  Blood and urine cultures are pending.  Currently on antibiotics empirically.     Rash on cheeks:  Almost resolved, probably from the bactrim.    Petechiae possibly from thrombocytopenia, from antibiotics.  Worsening, no signs of bleeding.    Acute renal failure:  Pre renal.  US ruled out obstructive causes.  Hydrate. Improved renal parameters but not back to baseline.     H/o Urolithiasis s/p recent cystoscopy, s/p urolift and now comes in for ARF, fever and chills.   Dr Lynnae Sandhoffahlsted not in office today, will get in touch tomorrow.      DVT prophylaxis: scd's Code Status:full code.  Family Communication: none at bedsid.e  Disposition Plan: possibly home in am if renal parameters are back to normal.    Consultants:   None.   Procedures: (none.   Antimicrobials:vanc and zosyn.    Subjective: Rash improved on the face.  Reports feeling better.   Objective: Vitals:   01/15/17 1930 01/15/17 2000 01/15/17 2037 01/16/17 0543  BP: (!) 102/58 104/68 101/64 (!) 101/56  Pulse: 79 85 84 88  Resp:   20 20  Temp:   100.2 F (37.9 C) 99.5 F (37.5 C)  TempSrc:   Oral Oral  SpO2: 100% 100% 100% 98%  Weight:      Height:   5\' 9"  (1.753 m)     Intake/Output Summary (Last 24 hours) at 01/16/2017 1117 Last data filed at 01/16/2017 0753 Gross per 24 hour  Intake 480 ml  Output -  Net 480 ml   Filed Weights     01/15/17 1536  Weight: 86.2 kg (190 lb)    Examination:  General exam: Appears calm and comfortable  Respiratory system: Clear to auscultation. Respiratory effort normal. Cardiovascular system: S1 & S2 heard, RRR. No JVD, murmurs, rubs, gallops or clicks. No pedal edema. Gastrointestinal system: Abdomen is nondistended, soft and nontender. No organomegaly or masses felt. Normal bowel sounds heard. Central nervous system: Alert and oriented. No focal neurological deficits. Extremities: Symmetric 5 x 5 power. Skin: erythematous rash on the face improved.  Petechiae on the legs worsened.  Psychiatry: Judgement and insight appear normal. Mood & affect appropriate.     Data Reviewed: I have personally reviewed following labs and imaging studies  CBC: Recent Labs  Lab 01/15/17 1546 01/16/17 0450  WBC 5.1 3.4*  NEUTROABS 4.7  --   HGB 13.3 11.5*  HCT 39.0 34.2*  MCV 88.6 88.1  PLT 113* 103*   Basic Metabolic Panel: Recent Labs  Lab 01/15/17 1546 01/16/17 0450  NA 137 138  K 3.7 3.8  CL 102 110  CO2 24 21*  GLUCOSE 118* 111*  BUN 42* 34*  CREATININE 2.06* 1.43*  CALCIUM 8.7* 8.0*   GFR: Estimated Creatinine Clearance: 55.3 mL/min (A) (by C-G formula based on SCr of 1.43 mg/dL (H)). Liver Function Tests: Recent Labs  Lab 01/15/17 1546 01/16/17 0450  AST 67* 62*  ALT 61 59  ALKPHOS 55 50  BILITOT 1.1 1.1  PROT 6.6 5.5*  ALBUMIN 3.5 2.8*   No results for input(s): LIPASE, AMYLASE in the last 168 hours. No results for input(s): AMMONIA in the last 168 hours. Coagulation Profile: No results for input(s): INR, PROTIME in the last 168 hours. Cardiac Enzymes: Recent Labs  Lab 01/15/17 1728  CKTOTAL 209   BNP (last 3 results) No results for input(s): PROBNP in the last 8760 hours. HbA1C: No results for input(s): HGBA1C in the last 72 hours. CBG: No results for input(s): GLUCAP in the last 168 hours. Lipid Profile: No results for input(s): CHOL, HDL,  LDLCALC, TRIG, CHOLHDL, LDLDIRECT in the last 72 hours. Thyroid Function Tests: No results for input(s): TSH, T4TOTAL, FREET4, T3FREE, THYROIDAB in the last 72 hours. Anemia Panel: No results for input(s): VITAMINB12, FOLATE, FERRITIN, TIBC, IRON, RETICCTPCT in the last 72 hours. Sepsis Labs: Recent Labs  Lab 01/15/17 1602  LATICACIDVEN 1.31    Recent Results (from the past 240 hour(s))  Blood culture (routine x 2)     Status: None (Preliminary result)   Collection Time: 01/15/17  3:46 PM  Result Value Ref Range Status   Specimen Description BLOOD LEFT ANTECUBITAL  Final   Special Requests   Final    BOTTLES DRAWN AEROBIC AND ANAEROBIC Blood Culture adequate volume   Culture   Final    NO GROWTH < 24 HOURS Performed at [facility], [address] [place], [place] [address]    Report Status PENDING  Incomplete  Blood culture (routine x 2)     Status: None (Preliminary result)   Collection Time: 01/15/17  5:55 PM  Result Value Ref Range Status   Specimen Description BLOOD RIGHT ANTECUBITAL  Final   Special Requests   Final    BOTTLES DRAWN AEROBIC AND ANAEROBIC Blood Culture adequate volume   Culture   Final    NO GROWTH < 24 HOURS Performed at [facility], [address], [place], [place] [address]    Report Status PENDING  Incomplete         Radiology Studies: Dg Chest 2 View  Result Date: 01/15/2017 CLINICAL DATA:  Night sweats, weight loss and fever. EXAM: CHEST  2 VIEW COMPARISON:  01/15/2017 and prior radiographs FINDINGS: The cardiomediastinal silhouette is unremarkable. There is no evidence of focal airspace disease, pulmonary edema, suspicious pulmonary nodule/mass, pleural effusion, or pneumothorax. No acute bony abnormalities are identified. IMPRESSION: No active cardiopulmonary disease. Electronically Signed   By: Harmon Pier M.D.   On: 01/15/2017 17:35   US Renal  Result Date: 01/15/2017 CLINICAL DATA:  Acute renal failure. Hypertension.  Patient's self-catheterizes the urinary bladder. Benign prostatic hypertrophy. EXAM: RENAL / URINARY TRACT ULTRASOUND COMPLETE COMPARISON:  None. FINDINGS: Right Kidney: Length: 11.2 cm. Echogenicity within normal limits. No mass or hydronephrosis visualized. Left Kidney: Length: 12.5 cm. Echogenicity within normal limits. No mass or hydronephrosis visualized. A 6 mm interpolar nonobstructing renal calculus is noted. Bladder: The bladder is nondistended and there is irregular mural thickening demonstrated some which is likely due to underdistention. Chronic cystitis is also possibility. IMPRESSION: 1. No sonographic findings for the patient's acute renal failure. 2. 6 mm interpolar left renal calculus without obstruction. 3. Thick-walled appearance of the bladder some which is likely due to underdistention. Chronic cystitis may also contribute to this appearance. Electronically Signed   By: Tollie Eth M.D.   On: 01/15/2017 19:39  Scheduled Meds: . feeding supplement (ENSURE ENLIVE)  237 mL Oral BID BM  . Influenza vac split quadrivalent PF  0.5 mL Intramuscular Tomorrow-1000  . sertraline  50 mg Oral Daily  . tamsulosin  0.4 mg Oral QPC supper   Continuous Infusions: . sodium chloride 100 mL/hr at 01/15/17 2215  . piperacillin-tazobactam (ZOSYN)  IV 3.375 g (01/16/17 1056)  . [START ON 01/17/2017] vancomycin       LOS: 0 days    Time spent: 35 min   Reubin Bushnell, MD [facility] Pager [phone]  If 7PM-7AM, please contact night-coverage [email or web address] Password [email or web address] 01/16/2017, 11:17 AM

## 2017-01-16 NOTE — Progress Notes (Signed)
Pharmacy Antibiotic Note  Ricardo Little is a 66 y.o. male with PMH BPH, urolithiasis, HTN, HLD, s/p recent cystoscopy with insertion of urolift mesh and cystolithalopaxy on 10/29 on bactrim PTA, admitted on 01/15/2017 with sepsis.  Pharmacy has been consulted for vancomycin and Zosyn dosing.  Today, 01/16/2017: - afeb, wbc low - scr down 1.43 (crcl~55)   Plan:  With scr improving, adjust Vancomycin to 1250 mg IV q24hr for est AUC 481 (Goal AUC 400-500 )  Measure vancomycin AUC at steady state as indicated  Zosyn 3.375 g IV every 8 hrs (infuse over 4 hrs)  ________________________________________  Height: 5\' 9"  (175.3 cm) Weight: 190 lb (86.2 kg) IBW/kg (Calculated) : 70.7  Temp (24hrs), Avg:99.1 F (37.3 C), Min:97.5 F (36.4 C), Max:100.2 F (37.9 C)  Recent Labs  Lab 01/15/17 1546 01/15/17 1602 01/16/17 0450  WBC 5.1  --  3.4*  CREATININE 2.06*  --  1.43*  LATICACIDVEN  --  1.31  --     Estimated Creatinine Clearance: 55.3 mL/min (A) (by C-G formula based on SCr of 1.43 mg/dL (H)).    Allergies  Allergen Reactions  . Bactrim [Sulfamethoxazole-Trimethoprim] Rash    Antimicrobials this admission: 11/7 Vanc >>  11/7 Zosyn >>   Dose adjustments this admission: ---  Microbiology results: 11/7 BCx: sent   Thank you for allowing pharmacy to be a part of this patient's care.  Dorna LeitzAnh Alastair Hennes, PharmD, BCPS 01/16/2017 11:13 AM

## 2017-01-16 NOTE — Care Management Note (Signed)
Case Management Note  Patient Details  Name: Ricardo Little MRN: [id number] Date of Birth: 1950/11/05  Subjective/Objective:    Pt admitted with Acute renal failure                Action/Plan: Plan to discharge home with no needs.   Expected Discharge Date:                  Expected Discharge Plan:  Home/Self Care  In-House Referral:     Discharge planning Services  CM Consult  Post Acute Care Choice:    Choice offered to:  Patient  DME Arranged:    DME Agency:     HH Arranged:    HH Agency:     Status of Service:  In process, will continue to follow  If discussed at Long Length of Stay Meetings, dates discussed:    Additional CommentsGeni Bers:  Clella Mckeel, RN 01/16/2017, 2:35 PM

## 2017-01-16 NOTE — Progress Notes (Signed)
Nutrition Brief Note  Patient identified on the Malnutrition Screening Tool (MST) Report  Patient reports appetite has improved. Appetite started to diminish over the past 7 weeks but pt was still eating meals.  PO intake: 100% x 2 breakfast trays  Wt Readings from Last 15 Encounters:  01/15/17 190 lb (86.2 kg)  01/06/17 189 lb 8 oz (86 kg)  07/21/14 201 lb (91.2 kg)  08/22/13 211 lb 3.2 oz (95.8 kg)  08/11/13 211 lb 6.4 oz (95.9 kg)  05/01/13 223 lb 12.3 oz (101.5 kg)  05/01/13 219 lb 9.6 oz (99.6 kg)  09/05/12 198 lb (89.8 kg)  08/27/12 209 lb 5 oz (94.9 kg)  08/20/12 220 lb (99.8 kg)  01/12/12 218 lb (98.9 kg)    Body mass index is 28.06 kg/m. Patient meets criteria for overweight based on current BMI.   Current diet order is regular, patient is consuming approximately 100% of meals at this time. Labs and medications reviewed.   No nutrition interventions warranted at this time. If nutrition issues arise, please consult RD.   Ricardo FrancoLindsey Sakiya Stepka, MS, RD, LDN [facility] Clinical Dietitian Pager: [phone] After Hours Pager: [phone]

## 2017-01-17 DIAGNOSIS — N179 Acute kidney failure, unspecified: Secondary | ICD-10-CM | POA: Diagnosis not present

## 2017-01-17 DIAGNOSIS — I10 Essential (primary) hypertension: Secondary | ICD-10-CM | POA: Diagnosis not present

## 2017-01-17 LAB — CBC WITH DIFFERENTIAL/PLATELET
Basophils Absolute: 0 10*3/uL (ref 0.0–0.1)
Basophils Relative: 1 %
EOS ABS: 0.2 10*3/uL (ref 0.0–0.7)
Eosinophils Relative: 6 %
HEMATOCRIT: 34.2 % — AB (ref 39.0–52.0)
HEMOGLOBIN: 11.7 g/dL — AB (ref 13.0–17.0)
LYMPHS PCT: 16 %
Lymphs Abs: 0.5 10*3/uL — ABNORMAL LOW (ref 0.7–4.0)
MCH: 30 pg (ref 26.0–34.0)
MCHC: 34.2 g/dL (ref 30.0–36.0)
MCV: 87.7 fL (ref 78.0–100.0)
Monocytes Absolute: 0.3 10*3/uL (ref 0.1–1.0)
Monocytes Relative: 11 %
NEUTROS ABS: 2 10*3/uL (ref 1.7–7.7)
NEUTROS PCT: 66 %
Platelets: 106 10*3/uL — ABNORMAL LOW (ref 150–400)
RBC: 3.9 MIL/uL — AB (ref 4.22–5.81)
RDW: 13.1 % (ref 11.5–15.5)
WBC: 3.1 10*3/uL — AB (ref 4.0–10.5)

## 2017-01-17 LAB — CREATININE, SERUM
Creatinine, Ser: 1.22 mg/dL (ref 0.61–1.24)
GFR calc Af Amer: >60 mL/min (ref >60)
GFR calc non Af Amer: >60 mL/min (ref >60)

## 2017-01-17 MED ORDER — LEVOFLOXACIN 500 MG PO TABS: 500.0000 mg | ORAL_TABLET | Freq: Every day | ORAL | 0 refills | Status: AC

## 2017-01-17 NOTE — Plan of Care (Signed)
  Education: Knowledge of General Education information will improve 01/17/2017 0057 - Progressing by Herbert PunAddison, Geremiah Fussell Y, RN   Health Behavior/Discharge Planning: Ability to manage health-related needs will improve 01/17/2017 0057 - Progressing by Herbert PunAddison, Kavitha Lansdale Y, RN

## 2017-01-20 LAB — CULTURE, BLOOD (ROUTINE X 2)
Culture: NO GROWTH
Culture: NO GROWTH
Special Requests: ADEQUATE
Special Requests: ADEQUATE

## 2017-01-20 NOTE — Discharge Summary (Signed)
Physician Discharge Summary  Ricardo Little [id number] DOB: [birth year] DOA: 01/15/2017  PCP: Merlene LaughterStoneking, Hal, MD  Admit date: 01/15/2017 Discharge date: 01/17/2017  Admitted From: Home.  Disposition:  Home.   Recommendations for Outpatient Follow-up:  1. Follow up with PCP in 1-2 weeks 2. Please obtain BMP/CBC in one week Please follow up with urology as recommended.    Discharge Condition:stable.  CODE STATUS: full code.  Diet recommendation: Heart Healthy Brief/Interim Summary: Ricardo HumblesKenneth J Gruberis a 66 y.o.malewith medical history significant ofBPH, urolithiasis, hypertension, hyperlipidemia, s/p recent procedure, cystoscopywith a urolift and cystolithalopaxy on 10/29, has been on keflex and bactrim, comes in for persistent fevers and chills.    Discharge Diagnoses:  Active Problems:   Hypertension   Hyperlipidemia   Acute renal failure (ARF) (HCC)  SIRS/ : Hypotension improved.  Afebrile, no leukocytosis.  Possibly viral infection vs UTI.  Blood and urine cultures are pending and negative so far. Started  on antibiotics empirically, transitioned to oral antibiotic to complete the course.     Rash on cheeks:  Almost resolved, probably from the bactrim.    Petechiae possibly from thrombocytopenia, from antibiotics.  Platelets have stabilized and petechiae improved since admission.    Acute renal failure:  Pre renal.  US ruled out obstructive causes.  Improved renal parameters with hydration.       H/o Urolithiasis s/p recent cystoscopy, s/p urolift and now comes in for ARF, fever and chills.  much improved. Recommend outpatient follow up with urology.     Discharge Instructions  Discharge Instructions    Diet - low sodium heart healthy   Complete by:  As directed      Allergies as of 01/17/2017      Reactions   Bactrim [sulfamethoxazole-trimethoprim] Rash      Medication List    STOP taking these medications    amLODipine-benazepril 5-20 MG capsule Commonly known as:  LOTREL   cephALEXin 500 MG capsule Commonly known as:  KEFLEX   sulfamethoxazole-trimethoprim 800-160 MG tablet Commonly known as:  BACTRIM DS,SEPTRA DS     TAKE these medications   acetaminophen 500 MG tablet Commonly known as:  TYLENOL Take 1,000 mg by mouth daily as needed for mild pain.   ALPRAZolam 0.25 MG tablet Commonly known as:  XANAX Take 0.25 mg 2 (two) times daily as needed by mouth for anxiety or sleep.   aspirin EC 81 MG tablet Take 81 mg by mouth daily.   atorvastatin 10 MG tablet Commonly known as:  LIPITOR Take 5 mg by mouth every morning.   DOCOSAHEXAENOIC ACID PO Take 1 capsule daily by mouth.   fish oil-omega-3 fatty acids 1000 MG capsule Take 2 g by mouth daily.   Flax Seed Oil 1000 MG Caps Take 1 capsule by mouth daily.   Flaxseed Oil 1000 MG Caps Take by mouth.   levofloxacin 500 MG tablet Commonly known as:  LEVAQUIN Take 1 tablet (500 mg total) daily for 4 days by mouth.   Magnesium 200 MG Tabs Take 1 tablet by mouth daily.   MULTI-VITAMINS Tabs Take by mouth.   multivitamin with minerals Tabs tablet Take 1 tablet by mouth daily.   sertraline 50 MG tablet Commonly known as:  ZOLOFT Take 25 mg by mouth every morning.   tamsulosin 0.4 MG Caps capsule Commonly known as:  FLOMAX Take 0.4 mg by mouth 2 (two) times daily.   traMADol 50 MG tablet Commonly known as:  ULTRAM Take 1 tablet (50 mg total)  by mouth every 6 (six) hours as needed. What changed:  reasons to take this      Follow-up Information    Stoneking, Hal, MD. Schedule an appointment as soon as possible for a visit in 1 week(s).   Specialty:  Internal Medicine Contact information: [address] [place] [address] [phone]          Allergies  Allergen Reactions  . Bactrim [Sulfamethoxazole-Trimethoprim] Rash    Consultations:  discussed with Dr Lynnae Sandhoff over the phone     Procedures/Studies: Dg Chest 2 View  Result Date: 01/15/2017 CLINICAL DATA:  Night sweats, weight loss and fever. EXAM: CHEST  2 VIEW COMPARISON:  01/15/2017 and prior radiographs FINDINGS: The cardiomediastinal silhouette is unremarkable. There is no evidence of focal airspace disease, pulmonary edema, suspicious pulmonary nodule/mass, pleural effusion, or pneumothorax. No acute bony abnormalities are identified. IMPRESSION: No active cardiopulmonary disease. Electronically Signed   By: Harmon Pier M.D.   On: 01/15/2017 17:35   US Renal  Result Date: 01/15/2017 CLINICAL DATA:  Acute renal failure. Hypertension. Patient's self-catheterizes the urinary bladder. Benign prostatic hypertrophy. EXAM: RENAL / URINARY TRACT ULTRASOUND COMPLETE COMPARISON:  None. FINDINGS: Right Kidney: Length: 11.2 cm. Echogenicity within normal limits. No mass or hydronephrosis visualized. Left Kidney: Length: 12.5 cm. Echogenicity within normal limits. No mass or hydronephrosis visualized. A 6 mm interpolar nonobstructing renal calculus is noted. Bladder: The bladder is nondistended and there is irregular mural thickening demonstrated some which is likely due to underdistention. Chronic cystitis is also possibility. IMPRESSION: 1. No sonographic findings for the patient's acute renal failure. 2. 6 mm interpolar left renal calculus without obstruction. 3. Thick-walled appearance of the bladder some which is likely due to underdistention. Chronic cystitis may also contribute to this appearance. Electronically Signed   By: Tollie Eth M.D.   On: 01/15/2017 19:39       Subjective: No new complaints.   Discharge Exam: Vitals:   01/17/17 0211 01/17/17 0549  BP: 117/77 91/62  Pulse:  67  Resp:  18  Temp: 98.7 F (37.1 C) 98 F (36.7 C)  SpO2:  99%   Vitals:   01/16/17 1435 01/16/17 2051 01/17/17 0211 01/17/17 0549  BP: 126/65 (!) 104/53 117/77 91/62  Pulse: 80 69  67  Resp: 18 18  18   Temp: 98 F (36.7 C)  98.4 F (36.9 C) 98.7 F (37.1 C) 98 F (36.7 C)  TempSrc: Oral Oral Oral Oral  SpO2: 100% 100%  99%  Weight:      Height:        General: Pt is alert, awake, not in acute distress Cardiovascular: RRR, S1/S2 +, no rubs, no gallops Respiratory: CTA bilaterally, no wheezing, no rhonchi Abdominal: Soft, NT, ND, bowel sounds + Extremities: no edema, no cyanosis    The results of significant diagnostics from this hospitalization (including imaging, microbiology, ancillary and laboratory) are listed below for reference.     Microbiology: Recent Results (from the past 240 hour(s))  Blood culture (routine x 2)     Status: None (Preliminary result)   Collection Time: 01/15/17  3:46 PM  Result Value Ref Range Status   Specimen Description BLOOD LEFT ANTECUBITAL  Final   Special Requests   Final    BOTTLES DRAWN AEROBIC AND ANAEROBIC Blood Culture adequate volume   Culture   Final    NO GROWTH 4 DAYS Performed at [facility], [address], [place], [place] [address]  Report Status PENDING  Incomplete  Blood culture (routine x 2)     Status: None (Preliminary result)   Collection Time: 01/15/17  5:55 PM  Result Value Ref Range Status   Specimen Description BLOOD RIGHT ANTECUBITAL  Final   Special Requests   Final    BOTTLES DRAWN AEROBIC AND ANAEROBIC Blood Culture adequate volume   Culture   Final    NO GROWTH 4 DAYS Performed at [facility], [address], [place], [place] [address]    Report Status PENDING  Incomplete     Labs: BNP (last 3 results) No results for input(s): BNP in the last 8760 hours. Basic Metabolic Panel: Recent Labs  Lab 01/15/17 1546 01/16/17 0450 01/17/17 0502  NA 137 138  --   K 3.7 3.8  --   CL 102 110  --   CO2 24 21*  --   GLUCOSE 118* 111*  --   BUN 42* 34*  --   CREATININE 2.06* 1.43* 1.22  CALCIUM 8.7* 8.0*  --    Liver Function Tests: Recent Labs  Lab 01/15/17 1546 01/16/17 0450  AST 67* 62*  ALT 61 59   ALKPHOS 55 50  BILITOT 1.1 1.1  PROT 6.6 5.5*  ALBUMIN 3.5 2.8*   No results for input(s): LIPASE, AMYLASE in the last 168 hours. No results for input(s): AMMONIA in the last 168 hours. CBC: Recent Labs  Lab 01/15/17 1546 01/16/17 0450 01/17/17 0910  WBC 5.1 3.4* 3.1*  NEUTROABS 4.7  --  2.0  HGB 13.3 11.5* 11.7*  HCT 39.0 34.2* 34.2*  MCV 88.6 88.1 87.7  PLT 113* 103* 106*   Cardiac Enzymes: Recent Labs  Lab 01/15/17 1728  CKTOTAL 209   BNP: Invalid input(s): POCBNP CBG: No results for input(s): GLUCAP in the last 168 hours. D-Dimer No results for input(s): DDIMER in the last 72 hours. Hgb A1c No results for input(s): HGBA1C in the last 72 hours. Lipid Profile No results for input(s): CHOL, HDL, LDLCALC, TRIG, CHOLHDL, LDLDIRECT in the last 72 hours. Thyroid function studies No results for input(s): TSH, T4TOTAL, T3FREE, THYROIDAB in the last 72 hours.  Invalid input(s): FREET3 Anemia work up No results for input(s): VITAMINB12, FOLATE, FERRITIN, TIBC, IRON, RETICCTPCT in the last 72 hours. Urinalysis    Component Value Date/Time   COLORURINE AMBER (A) 01/15/2017 1741   APPEARANCEUR HAZY (A) 01/15/2017 1741   LABSPEC 1.025 01/15/2017 1741   PHURINE 5.0 01/15/2017 1741   GLUCOSEU NEGATIVE 01/15/2017 1741   HGBUR NEGATIVE 01/15/2017 1741   BILIRUBINUR NEGATIVE 01/15/2017 1741   BILIRUBINUR neg 09/05/2012 1140   KETONESUR NEGATIVE 01/15/2017 1741   PROTEINUR 30 (A) 01/15/2017 1741   UROBILINOGEN 0.2 09/05/2012 1140   UROBILINOGEN 0.2 08/20/2012 2040   NITRITE NEGATIVE 01/15/2017 1741   LEUKOCYTESUR LARGE (A) 01/15/2017 1741   Sepsis Labs Invalid input(s): PROCALCITONIN,  WBC,  LACTICIDVEN Microbiology Recent Results (from the past 240 hour(s))  Blood culture (routine x 2)     Status: None (Preliminary result)   Collection Time: 01/15/17  3:46 PM  Result Value Ref Range Status   Specimen Description BLOOD LEFT ANTECUBITAL  Final   Special Requests    Final    BOTTLES DRAWN AEROBIC AND ANAEROBIC Blood Culture adequate volume   Culture   Final    NO GROWTH 4 DAYS Performed at [facility], [address], [place], [place] [address]    Report Status PENDING  Incomplete  Blood  culture (routine x 2)     Status: None (Preliminary result)   Collection Time: 01/15/17  5:55 PM  Result Value Ref Range Status   Specimen Description BLOOD RIGHT ANTECUBITAL  Final   Special Requests   Final    BOTTLES DRAWN AEROBIC AND ANAEROBIC Blood Culture adequate volume   Culture   Final    NO GROWTH 4 DAYS Performed at [facility], [address] [place], [place] [address]    Report Status PENDING  Incomplete     Time coordinating discharge: Over 30 minutes  SIGNED:   Kathlen ModyAKULA,Arabia Nylund, MD  [facility] 01/20/2017, 12:33 PM Pager   If 7PM-7AM, please contact night-coverage [email or web address] Password [email or web address]

## 2018-11-12 IMAGING — US US RENAL
1 series · 14 of 25 positions shown · non-contrast
Comparison: None.

CLINICAL DATA: Acute renal failure. Hypertension. Patient's
self-catheterizes the urinary bladder. Benign prostatic hypertrophy.

EXAM:
RENAL / URINARY TRACT ULTRASOUND COMPLETE

[Series 1: us renal · 0.23mm/px · 14 of 33 slices shown]
[im 1/33]
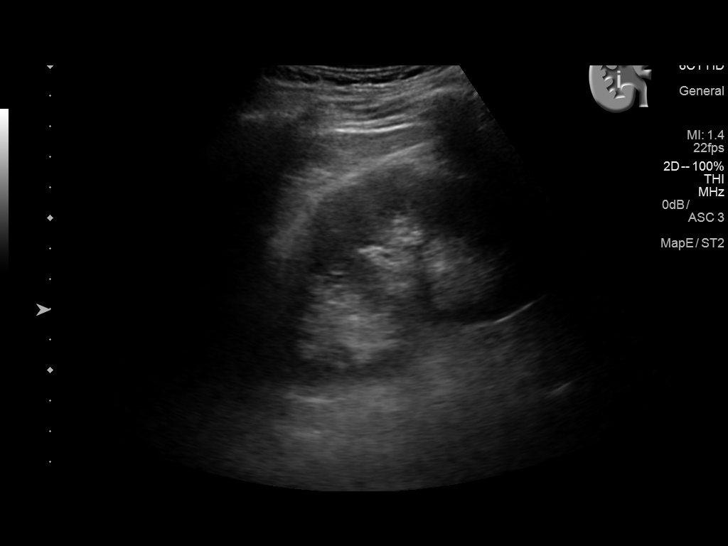
[im 3/33]
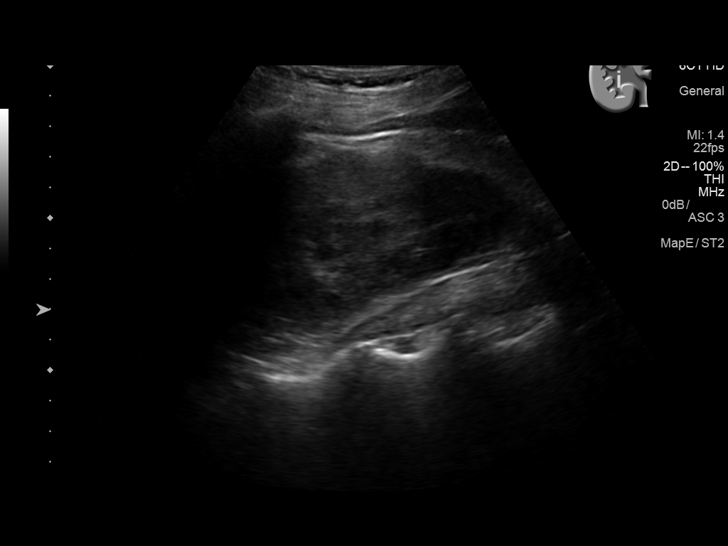
[im 6/33]
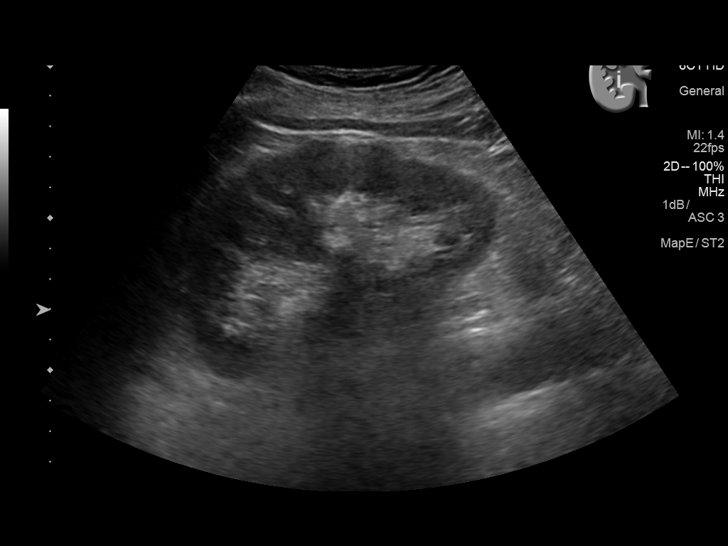
[im 9/33]
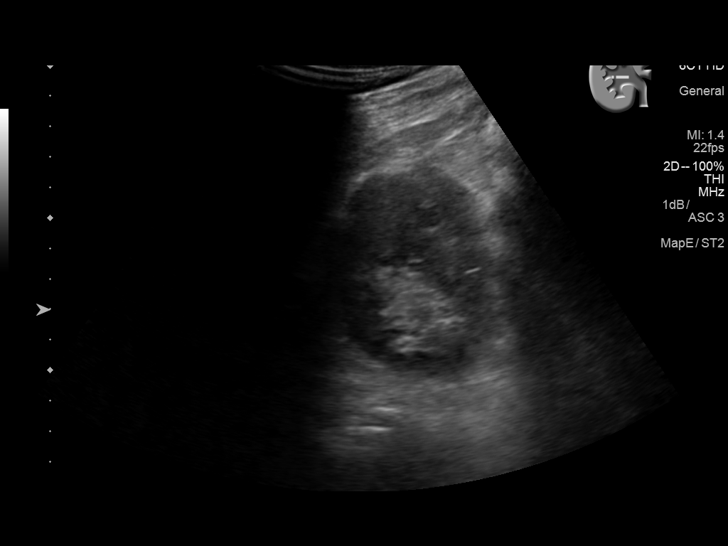
[im 11/33]
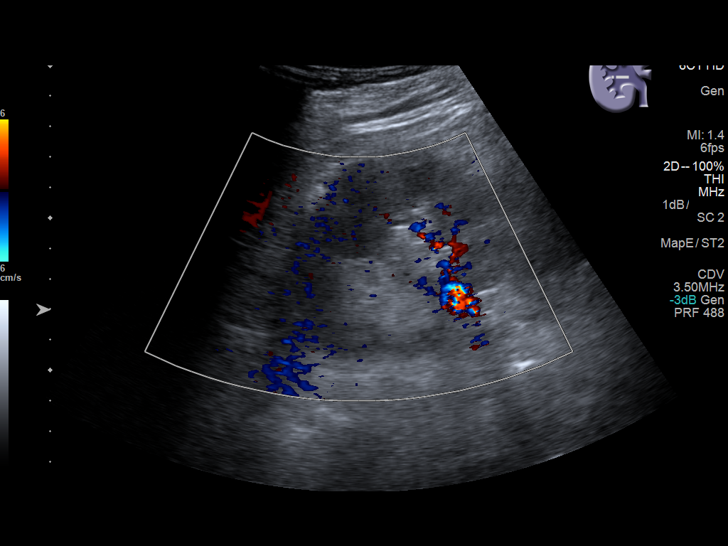
[im 13/33]
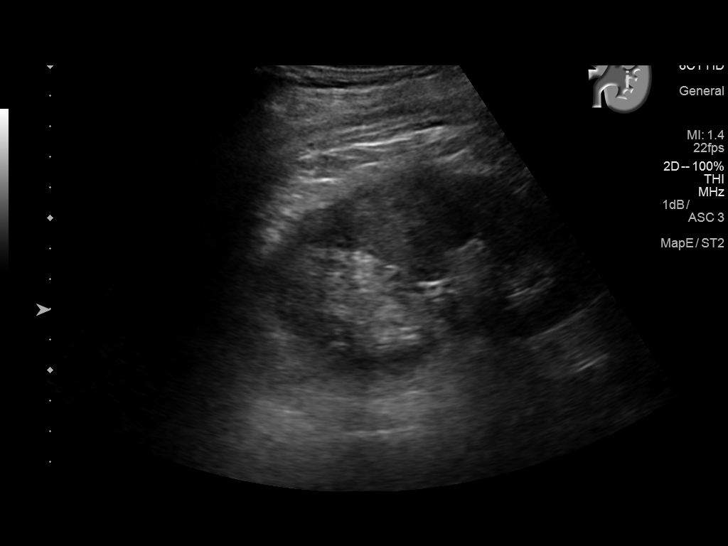
[im 15/33]
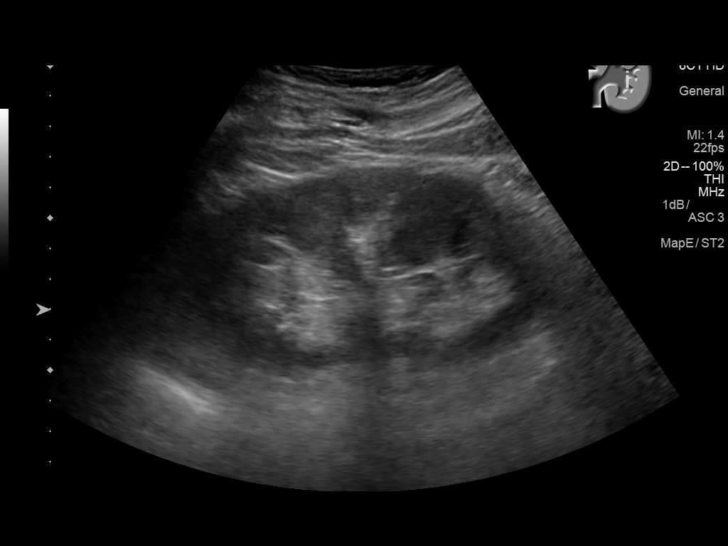
[im 18/33]
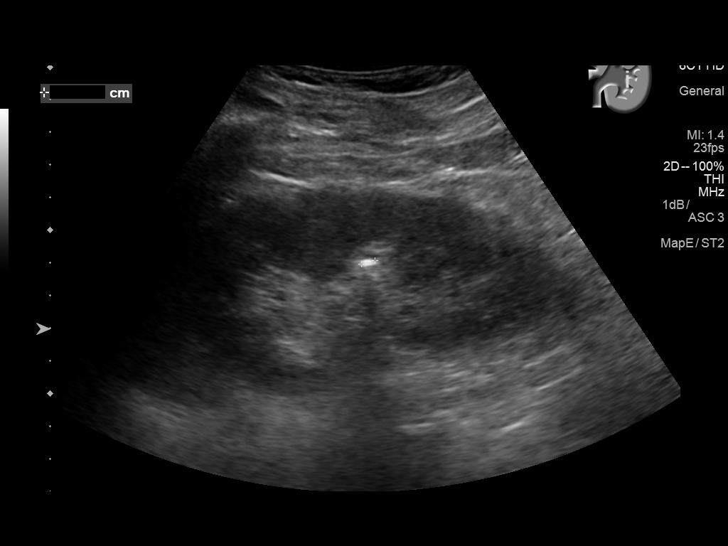
[im 21/33]
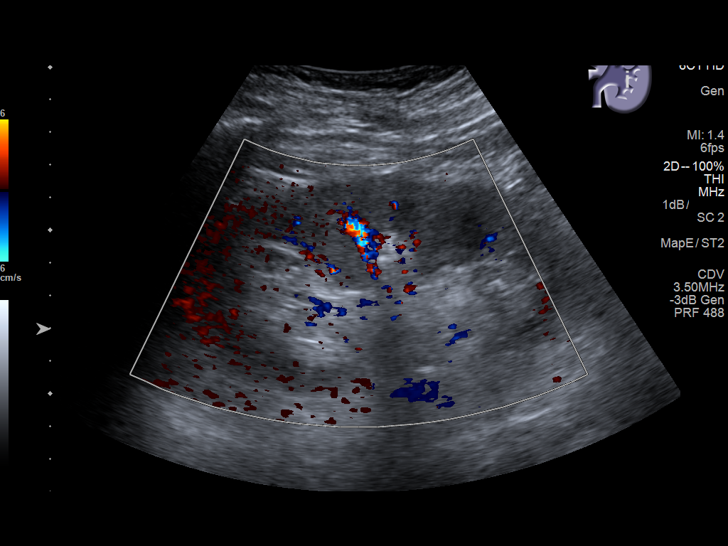
[im 22/33]
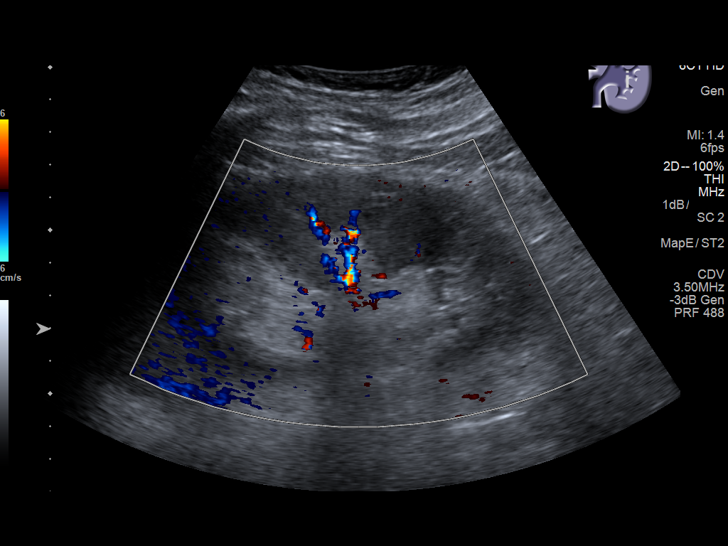
[im 25/33]
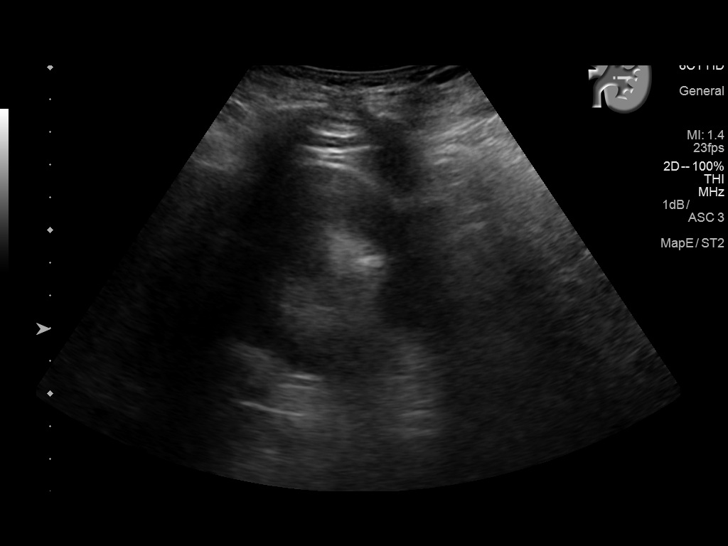
[im 27/33]
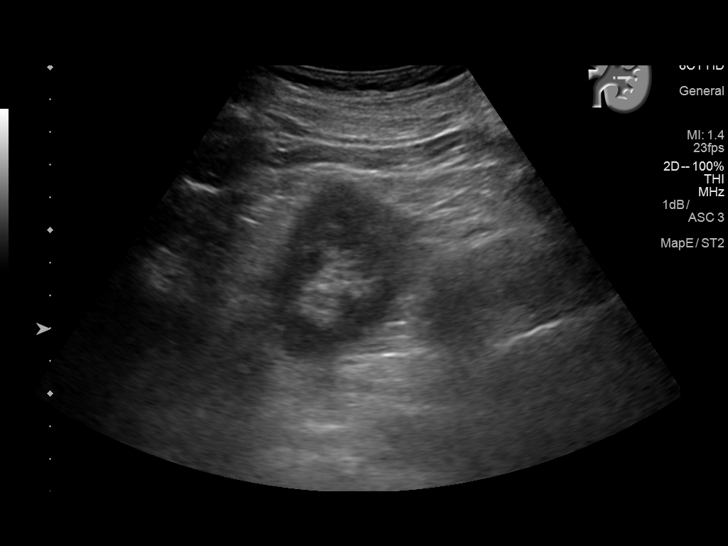
[im 30/33]
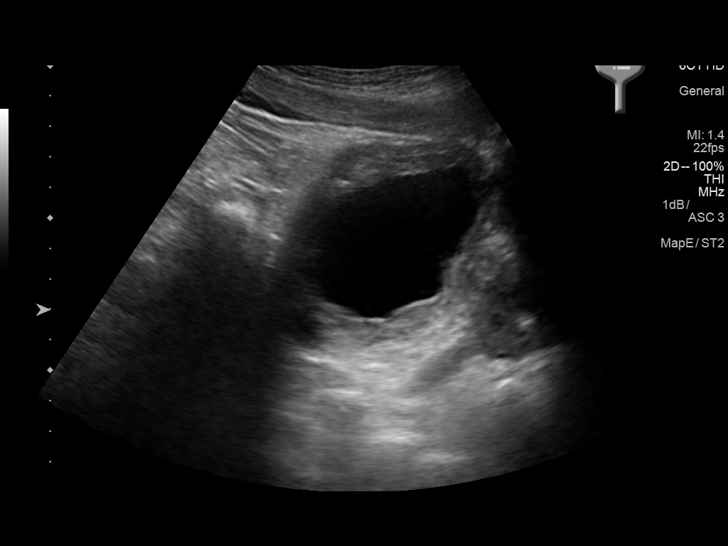
[im 33/33]
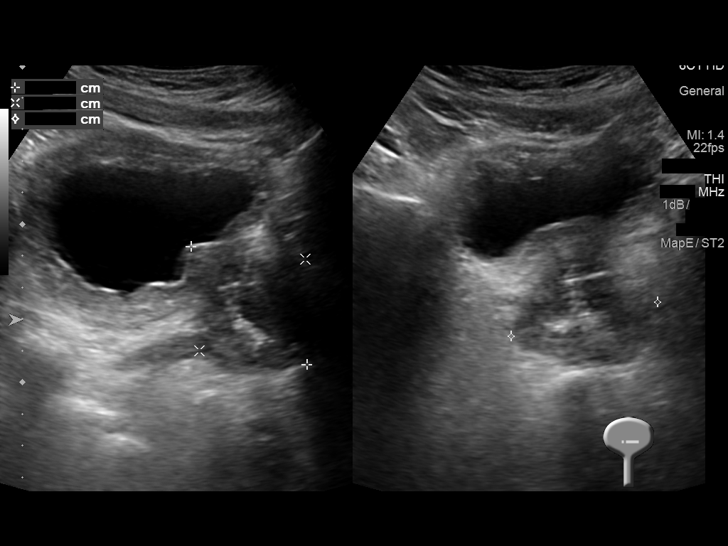

[14 of 25 positions shown; findings below may reference images not displayed]

FINDINGS: Right Kidney:

Length: 11.2 cm. Echogenicity within normal limits. No mass or
hydronephrosis visualized.

Left Kidney:

Length: 12.5 cm. Echogenicity within normal limits. No mass or
hydronephrosis visualized. A 6 mm interpolar nonobstructing renal
calculus is noted.

Bladder:

The bladder is nondistended and there is irregular mural thickening
demonstrated some which is likely due to underdistention. Chronic
cystitis is also possibility.
IMPRESSION: 1. No sonographic findings for the patient's acute renal failure.
2. 6 mm interpolar left renal calculus without obstruction.
3. Thick-walled appearance of the bladder some which is likely due
to underdistention. Chronic cystitis may also contribute to this
appearance.

## 2019-04-23 ENCOUNTER — Ambulatory Visit: Payer: BC Managed Care – PPO

## 2019-07-16 ENCOUNTER — Ambulatory Visit
Admission: RE | Admit: 2019-07-16 | Discharge: 2019-07-16 | Disposition: A | Discharge status: Home / Self Care | Payer: BC Managed Care – PPO | Source: Ambulatory Visit | Attending: Internal Medicine | Admitting: Internal Medicine

## 2019-07-16 ENCOUNTER — Other Ambulatory Visit: Payer: Self-pay | Admitting: Internal Medicine

## 2019-07-16 DIAGNOSIS — R0781 Pleurodynia: Secondary | ICD-10-CM

## 2020-07-24 ENCOUNTER — Ambulatory Visit: Payer: BC Managed Care – PPO

## 2020-10-03 ENCOUNTER — Other Ambulatory Visit: Payer: Self-pay | Admitting: Geriatric Medicine

## 2020-10-03 DIAGNOSIS — R109 Unspecified abdominal pain: Secondary | ICD-10-CM

## 2020-10-17 ENCOUNTER — Other Ambulatory Visit: Payer: BC Managed Care – PPO

## 2020-12-27 ENCOUNTER — Other Ambulatory Visit: Payer: Self-pay | Admitting: Urology

## 2020-12-28 ENCOUNTER — Other Ambulatory Visit: Payer: Self-pay

## 2020-12-28 ENCOUNTER — Encounter (HOSPITAL_BASED_OUTPATIENT_CLINIC_OR_DEPARTMENT_OTHER): Payer: Self-pay | Admitting: Urology

## 2020-12-28 NOTE — Progress Notes (Signed)
Spoke w/ via phone for pre-op interview---pt Lab needs dos---- I stat, ekg              Lab results------none COVID test -----01-01-2021 overnight stay Arrive at -------530 am 01-04-2021 NPO after MN NO Solid Food.  Clear liquids from MN until---430 am  Med rec completed Medications to take morning of surgery -----amlodipine, atorvastatin, xanax prn Diabetic medication -----n/a Patient instructed to bring photo id and insurance card day of surgery Patient aware to have Driver (ride ) / caregiver    for 24 hours after surgery  wife elaine Patient Special Instructions -----pt given overnight stay instructions Pre-Op special Istructions -----none Patient verbalized understanding of instructions that were given at this phone interview. Patient denies shortness of breath, chest pain, fever, cough at this phone interview.

## 2021-01-01 ENCOUNTER — Other Ambulatory Visit: Payer: Self-pay | Admitting: Urology

## 2021-01-01 DIAGNOSIS — U071 COVID-19: Secondary | ICD-10-CM

## 2021-01-01 HISTORY — DX: COVID-19: U07.1

## 2021-01-01 LAB — SARS CORONAVIRUS 2 (TAT 6-24 HRS): SARS Coronavirus 2: POSITIVE — AB

## 2021-01-01 NOTE — Progress Notes (Signed)
Left message with selita pt covid test 01-01-2021 positive

## 2021-01-02 NOTE — Progress Notes (Signed)
Left message with Ricardo Little pt covid positive 01-01-2021

## 2021-01-15 ENCOUNTER — Encounter (HOSPITAL_BASED_OUTPATIENT_CLINIC_OR_DEPARTMENT_OTHER): Payer: Self-pay | Admitting: Urology

## 2021-01-16 ENCOUNTER — Encounter (HOSPITAL_BASED_OUTPATIENT_CLINIC_OR_DEPARTMENT_OTHER): Payer: Self-pay | Admitting: Urology

## 2021-01-16 ENCOUNTER — Other Ambulatory Visit: Payer: Self-pay

## 2021-01-16 NOTE — Progress Notes (Signed)
Spoke w/ via phone for pre-op interview---pt Lab needs dos----     I stat, ekg          Lab results------none COVID test -----positive covid test 01-01-2021 in epic, no retest needed Arrive at -------530 am 01-18-2021 NPO after MN NO Solid Food.  Clear liquids from MN until---430 am Med rec completed Medications to take morning of surgery -----amlodipine, atorvastatin, xanax prn Diabetic medication -----n/a Patient instructed to bring photo id and insurance card day of surgery Patient aware to have Driver (ride ) / caregiver    for 24 hours after surgery wife Ricardo Little Patient Special Instructions -----pt given overnight stay instructions Pre-Op special Istructions -----none Patient verbalized understanding of instructions that were given at this phone interview. Patient denies shortness of breath, chest pain, fever, cough at this phone interview.

## 2021-01-17 NOTE — H&P (Signed)
H&P  Chief Complaint: Bladder stones, urinary retention  History of Present Illness: 70 yo male w. H/o BPH-s/p urolift 2018. Recently found to have stone at bladder neck as well as possible other bladder stones. He has been on cic for urinary retention from BPH. He presents at this time for cystolithaopaxy as well as probable TURP.  Past Medical History:  Diagnosis Date   BPH with urinary obstruction    COVID 01/01/2021   fatigue x 3 days took paxlovid x 5 days all symptoms resolved   History of benign thyroid tumor    yrs ago was removed per pt on 12-28-2020   History of kidney stones    Hyperlipidemia    Hypertension    Nephrolithiasis    Self-catheterizes urinary bladder    3 to 4 x per day   Urinary retention     Past Surgical History:  Procedure Laterality Date   colonscopy     last done 2020   CYSTOSCOPY WITH INSERTION OF UROLIFT N/A 01/06/2017   Procedure: CYSTOSCOPY WITH INSERTION OF UROLIFT;  Surgeon: Marcine Matar, MD;  Location: [facility];  Service: Urology;  Laterality: N/A;   CYSTOSCOPY WITH LITHOLAPAXY N/A 07/21/2014   Procedure: CYSTOSCOPY WITH LITHOLAPAXY;  Surgeon: Marcine Matar, MD;  Location: [facility];  Service: Urology;  Laterality: N/A;   CYSTOSCOPY WITH RETROGRADE PYELOGRAM, URETEROSCOPY AND STENT PLACEMENT Bilateral 08/27/2012   Procedure: CYSTOSCOPY WITH BILATERAL RETROGRADE PYELOGRAM, BILATERAL URETEROSCOPY , STENT PLACEMENT  ;  Surgeon: Marcine Matar, MD;  Location: [facility];  Service: Urology;  Laterality: Bilateral;   CYSTOSCOPY WITH RETROGRADE PYELOGRAM, URETEROSCOPY AND STENT PLACEMENT Right 07/21/2014   Procedure: CYSTOSCOPY WITH RETROGRADE PYELOGRAM, URETEROSCOPY ,STONE EXTRACTION AND STENT PLACEMENT;  Surgeon: Marcine Matar, MD;  Location: [facility];  Service: Urology;  Laterality: Right;   HOLMIUM LASER APPLICATION Bilateral 08/27/2012   Procedure: HOLMIUM LASER  EXTRACTION OF STONES;  Surgeon: Marcine Matar, MD;  Location: [facility];  Service: Urology;  Laterality: Bilateral;   HOLMIUM LASER APPLICATION Right 07/21/2014   Procedure: HOLMIUM LASER APPLICATION;  Surgeon: Marcine Matar, MD;  Location: [facility];  Service: Urology;  Laterality: Right;   KNEE ARTHROSCOPY W/ MENISCECTOMY  08/10/2006   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     Allergies:  Allergies  Allergen Reactions   Bactrim [Sulfamethoxazole-Trimethoprim] Rash    Family History  Problem Relation Age of Onset   Cancer Mother        Ovarian   Cancer Father        Liver   Heart disease Father     Social History:  reports that he has never smoked. He has never used smokeless tobacco. He reports that he does not currently use alcohol. He reports that he does not use drugs.  ROS: A complete review of systems was performed.  All systems  are negative except for pertinent findings as noted.  Physical Exam:  Vital signs in last 24 hours: Ht 5\' 9"  (1.753 m)   Wt 86.2 kg   BMI 28.06 kg/m  Constitutional:  Alert and oriented, No acute distress Cardiovascular: Regular rate  Respiratory: Normal respiratory effort Neurologic: Grossly intact, no focal deficits Psychiatric: Normal mood and affect     Impression/Assessment:  BPH w/ retention, bladder calculi  Plan:  Cystolithalopaxy, probable  TURP

## 2021-01-18 ENCOUNTER — Encounter (HOSPITAL_BASED_OUTPATIENT_CLINIC_OR_DEPARTMENT_OTHER): Payer: Self-pay | Admitting: Urology

## 2021-01-18 ENCOUNTER — Encounter (HOSPITAL_BASED_OUTPATIENT_CLINIC_OR_DEPARTMENT_OTHER)
Admission: RE | Disposition: A | Discharge status: Home / Self Care | Payer: Self-pay | Source: Home / Self Care | Attending: Urology

## 2021-01-18 ENCOUNTER — Ambulatory Visit (HOSPITAL_BASED_OUTPATIENT_CLINIC_OR_DEPARTMENT_OTHER): Payer: BC Managed Care – PPO | Admitting: Certified Registered"

## 2021-01-18 ENCOUNTER — Ambulatory Visit (HOSPITAL_BASED_OUTPATIENT_CLINIC_OR_DEPARTMENT_OTHER)
Admission: RE | Admit: 2021-01-18 | Discharge: 2021-01-18 | Disposition: A | Discharge status: Home / Self Care | Payer: BC Managed Care – PPO | Attending: Urology | Admitting: Urology

## 2021-01-18 DIAGNOSIS — I10 Essential (primary) hypertension: Secondary | ICD-10-CM | POA: Diagnosis not present

## 2021-01-18 DIAGNOSIS — N201 Calculus of ureter: Secondary | ICD-10-CM | POA: Diagnosis not present

## 2021-01-18 DIAGNOSIS — R338 Other retention of urine: Secondary | ICD-10-CM | POA: Diagnosis not present

## 2021-01-18 DIAGNOSIS — N401 Enlarged prostate with lower urinary tract symptoms: Secondary | ICD-10-CM | POA: Diagnosis present

## 2021-01-18 DIAGNOSIS — Z79899 Other long term (current) drug therapy: Secondary | ICD-10-CM | POA: Insufficient documentation

## 2021-01-18 HISTORY — PX: CYSTOSCOPY WITH LITHOLAPAXY: SHX1425

## 2021-01-18 HISTORY — PX: TRANSURETHRAL RESECTION OF PROSTATE: SHX73

## 2021-01-18 LAB — POCT I-STAT, CHEM 8
BUN: 19 mg/dL (ref 8–23)
Calcium, Ion: 1.24 mmol/L (ref 1.15–1.40)
Chloride: 106 mmol/L (ref 98–111)
Creatinine, Ser: 0.9 mg/dL (ref 0.61–1.24)
Glucose, Bld: 98 mg/dL (ref 70–99)
HCT: 41 % (ref 39.0–52.0)
Hemoglobin: 13.9 g/dL (ref 13.0–17.0)
Potassium: 3.6 mmol/L (ref 3.5–5.1)
Sodium: 144 mmol/L (ref 135–145)
TCO2: 25 mmol/L (ref 22–32)

## 2021-01-18 SURGERY — TURP (TRANSURETHRAL RESECTION OF PROSTATE)
Anesthesia: General | Site: Prostate

## 2021-01-18 MED ORDER — ONDANSETRON HCL 4 MG/2ML IJ SOLN: 4.0000 mg | Freq: Once | INTRAMUSCULAR | Status: DC | PRN

## 2021-01-18 MED ORDER — LIDOCAINE 2% (20 MG/ML) 5 ML SYRINGE
INTRAMUSCULAR | Status: AC
  Filled 2021-01-18: qty 5

## 2021-01-18 MED ORDER — LACTATED RINGERS IV SOLN: INTRAVENOUS | Status: DC

## 2021-01-18 MED ORDER — LIDOCAINE 2% (20 MG/ML) 5 ML SYRINGE
INTRAMUSCULAR | Status: DC | PRN
  Administered 2021-01-18: 80 mg via INTRAVENOUS

## 2021-01-18 MED ORDER — PROPOFOL 10 MG/ML IV BOLUS
INTRAVENOUS | Status: DC | PRN
  Administered 2021-01-18: 160 mg via INTRAVENOUS

## 2021-01-18 MED ORDER — ONDANSETRON HCL 4 MG/2ML IJ SOLN
INTRAMUSCULAR | Status: AC
  Filled 2021-01-18: qty 2

## 2021-01-18 MED ORDER — FENTANYL CITRATE (PF) 100 MCG/2ML IJ SOLN
INTRAMUSCULAR | Status: DC | PRN
  Administered 2021-01-18 (×2): 50 ug via INTRAVENOUS

## 2021-01-18 MED ORDER — DEXAMETHASONE SODIUM PHOSPHATE 10 MG/ML IJ SOLN
INTRAMUSCULAR | Status: AC
  Filled 2021-01-18: qty 1

## 2021-01-18 MED ORDER — ACETAMINOPHEN 500 MG PO TABS
1000.0000 mg | ORAL_TABLET | Freq: Once | ORAL | Status: AC
  Administered 2021-01-18: 1000 mg via ORAL

## 2021-01-18 MED ORDER — EPHEDRINE 5 MG/ML INJ
INTRAVENOUS | Status: AC
  Filled 2021-01-18: qty 5

## 2021-01-18 MED ORDER — SODIUM CHLORIDE 0.9 % IR SOLN
Status: DC | PRN
  Administered 2021-01-18: 5000 mL

## 2021-01-18 MED ORDER — EPHEDRINE SULFATE-NACL 50-0.9 MG/10ML-% IV SOSY
PREFILLED_SYRINGE | INTRAVENOUS | Status: DC | PRN
  Administered 2021-01-18 (×3): 5 mg via INTRAVENOUS

## 2021-01-18 MED ORDER — CEFAZOLIN SODIUM-DEXTROSE 2-4 GM/100ML-% IV SOLN
2.0000 g | INTRAVENOUS | Status: AC
  Administered 2021-01-18: 2 g via INTRAVENOUS

## 2021-01-18 MED ORDER — CEFAZOLIN SODIUM-DEXTROSE 2-4 GM/100ML-% IV SOLN
INTRAVENOUS | Status: AC
  Filled 2021-01-18: qty 100

## 2021-01-18 MED ORDER — CEPHALEXIN 500 MG PO CAPS: 500.0000 mg | ORAL_CAPSULE | Freq: Two times a day (BID) | ORAL | 0 refills | Status: AC

## 2021-01-18 MED ORDER — FENTANYL CITRATE (PF) 100 MCG/2ML IJ SOLN: 25.0000 ug | INTRAMUSCULAR | Status: DC | PRN

## 2021-01-18 MED ORDER — FENTANYL CITRATE (PF) 100 MCG/2ML IJ SOLN
INTRAMUSCULAR | Status: AC
  Filled 2021-01-18: qty 2

## 2021-01-18 MED ORDER — ONDANSETRON HCL 4 MG/2ML IJ SOLN
INTRAMUSCULAR | Status: DC | PRN
  Administered 2021-01-18: 4 mg via INTRAVENOUS

## 2021-01-18 MED ORDER — PROPOFOL 10 MG/ML IV BOLUS
INTRAVENOUS | Status: AC
  Filled 2021-01-18: qty 20

## 2021-01-18 MED ORDER — DEXAMETHASONE SODIUM PHOSPHATE 10 MG/ML IJ SOLN
INTRAMUSCULAR | Status: DC | PRN
  Administered 2021-01-18: 10 mg via INTRAVENOUS

## 2021-01-18 MED ORDER — ACETAMINOPHEN 500 MG PO TABS
ORAL_TABLET | ORAL | Status: AC
  Filled 2021-01-18: qty 2

## 2021-01-18 SURGICAL SUPPLY — 40 items
BAG DRAIN URO-CYSTO SKYTR STRL (DRAIN) ×5 IMPLANT
BAG DRN RND TRDRP ANRFLXCHMBR (UROLOGICAL SUPPLIES) ×3
BAG DRN UROCATH (DRAIN) ×3
BAG URINE DRAIN 2000ML AR STRL (UROLOGICAL SUPPLIES) ×5 IMPLANT
BAG URINE LEG 500ML (DRAIN) IMPLANT
CATH FOLEY 2WAY SLVR  5CC 18FR (CATHETERS) ×5
CATH FOLEY 2WAY SLVR  5CC 20FR (CATHETERS)
CATH FOLEY 2WAY SLVR  5CC 22FR (CATHETERS)
CATH FOLEY 2WAY SLVR 30CC 22FR (CATHETERS) IMPLANT
CATH FOLEY 2WAY SLVR 5CC 18FR (CATHETERS) ×3 IMPLANT
CATH FOLEY 2WAY SLVR 5CC 20FR (CATHETERS) IMPLANT
CATH FOLEY 2WAY SLVR 5CC 22FR (CATHETERS) IMPLANT
CATH FOLEY 3WAY 30CC 22FR (CATHETERS) IMPLANT
CATH HEMA 3WAY 30CC 24FR COUDE (CATHETERS) IMPLANT
CATH HEMA 3WAY 30CC 24FR RND (CATHETERS) IMPLANT
CLOTH BEACON ORANGE TIMEOUT ST (SAFETY) IMPLANT
ELECT REM PT RETURN 9FT ADLT (ELECTROSURGICAL) ×5
ELECTRODE REM PT RTRN 9FT ADLT (ELECTROSURGICAL) ×3 IMPLANT
EVACUATOR MICROVAS BLADDER (UROLOGICAL SUPPLIES) ×5 IMPLANT
FIBER LASER FLEXIVA 1000 (UROLOGICAL SUPPLIES) IMPLANT
FIBER LASER FLEXIVA 200 (UROLOGICAL SUPPLIES) IMPLANT
FIBER LASER FLEXIVA 365 (UROLOGICAL SUPPLIES) IMPLANT
FIBER LASER FLEXIVA 550 (UROLOGICAL SUPPLIES) IMPLANT
GLOVE SURG ENC MOIS LTX SZ8 (GLOVE) ×5 IMPLANT
GOWN STRL REUS W/TWL XL LVL3 (GOWN DISPOSABLE) ×5 IMPLANT
HOLDER FOLEY CATH W/STRAP (MISCELLANEOUS) ×5 IMPLANT
IV NS IRRIG 3000ML ARTHROMATIC (IV SOLUTION) ×15 IMPLANT
KIT TURNOVER CYSTO (KITS) ×5 IMPLANT
LOOP CUT BIPOLAR 24F LRG (ELECTROSURGICAL) ×5 IMPLANT
MANIFOLD NEPTUNE II (INSTRUMENTS) ×5 IMPLANT
NS IRRIG 1000ML POUR BTL (IV SOLUTION) ×5 IMPLANT
PACK CYSTO (CUSTOM PROCEDURE TRAY) ×5 IMPLANT
PLUG CATH AND CAP STER (CATHETERS) IMPLANT
SYR 30ML LL (SYRINGE) ×5 IMPLANT
SYR TOOMEY IRRIG 70ML (MISCELLANEOUS)
SYRINGE TOOMEY IRRIG 70ML (MISCELLANEOUS) IMPLANT
TUBE CONNECTING 12'X1/4 (SUCTIONS) ×1
TUBE CONNECTING 12X1/4 (SUCTIONS) ×4 IMPLANT
TUBING UROLOGY SET (TUBING) ×5 IMPLANT
WATER STERILE IRR 500ML POUR (IV SOLUTION) ×5 IMPLANT

## 2021-01-18 NOTE — Anesthesia Preprocedure Evaluation (Addendum)
Anesthesia Evaluation  Patient identified by MRN, date of birth, ID band Patient awake    Reviewed: Allergy & Precautions, NPO status , Patient's Chart, lab work & pertinent test results  Airway Mallampati: I  TM Distance: >3 FB Neck ROM: Full    Dental  (+) Teeth Intact, Dental Advisory Given   Pulmonary neg pulmonary ROS,    Pulmonary exam normal breath sounds clear to auscultation       Cardiovascular hypertension, Pt. on medications Normal cardiovascular exam Rhythm:Regular Rate:Normal     Neuro/Psych negative neurological ROS     GI/Hepatic negative GI ROS, Neg liver ROS,   Endo/Other  negative endocrine ROS  Renal/GU negative Renal ROS Bladder dysfunction  BLADDER STONE, BENIGN PROSTATIC HYPERPLASIA    Musculoskeletal negative musculoskeletal ROS (+)   Abdominal   Peds  Hematology negative hematology ROS (+)   Anesthesia Other Findings Day of surgery medications reviewed with the patient.  Reproductive/Obstetrics                            Anesthesia Physical Anesthesia Plan  ASA: 2  Anesthesia Plan: General   Post-op Pain Management:    Induction: Intravenous  PONV Risk Score and Plan: 3 and Dexamethasone, Ondansetron and Treatment may vary due to age or medical condition  Airway Management Planned: LMA  Additional Equipment:   Intra-op Plan:   Post-operative Plan: Extubation in OR  Informed Consent: I have reviewed the patients History and Physical, chart, labs and discussed the procedure including the risks, benefits and alternatives for the proposed anesthesia with the patient or authorized representative who has indicated his/her understanding and acceptance.     Dental advisory given  Plan Discussed with: CRNA  Anesthesia Plan Comments:         Anesthesia Quick Evaluation

## 2021-01-18 NOTE — Interval H&P Note (Signed)
History and Physical Interval Note:  01/18/2021 7:26 AM  Ricardo Little  has presented today for surgery, with the diagnosis of BLADDER STONE, BENIGN PROSTATIC HYPERPLASIA.  The various methods of treatment have been discussed with the patient and family. After consideration of risks, benefits and other options for treatment, the patient has consented to  Procedure(s): TRANSURETHRAL RESECTION OF THE PROSTATE (TURP) (N/A) CYSTOSCOPY WITH LITHOLAPAXY (N/A) HOLMIUM LASER APPLICATION (N/A) as a surgical intervention.  The patient's history has been reviewed, patient examined, no change in status, stable for surgery.  I have reviewed the patient's chart and labs.  Questions were answered to the patient's satisfaction.     Bertram Millard Tenille Morrill

## 2021-01-18 NOTE — Anesthesia Postprocedure Evaluation (Signed)
Anesthesia Post Note  Patient: Ricardo Little  Procedure(s) Performed: TRANSURETHRAL RESECTION OF THE PROSTATE (TURP) (Prostate) CYSTOSCOPY WITH LITHOLAPAXY (Bladder)     Patient location during evaluation: PACU Anesthesia Type: General Level of consciousness: awake and alert Pain management: pain level controlled Vital Signs Assessment: post-procedure vital signs reviewed and stable Respiratory status: spontaneous breathing, nonlabored ventilation and respiratory function stable Cardiovascular status: blood pressure returned to baseline and stable Postop Assessment: no apparent nausea or vomiting Anesthetic complications: no   No notable events documented.  Last Vitals:  Vitals:   01/18/21 0845 01/18/21 0930  BP: 118/70 123/82  Pulse: 67 79  Resp: 14 14  Temp: (!) 36.3 C (!) 36.4 C  SpO2: 96% 97%    Last Pain:  Vitals:   01/18/21 0930  TempSrc:   PainSc: 0-No pain                 Cecile Hearing

## 2021-01-18 NOTE — Discharge Instructions (Addendum)
You may see some blood in the urine and may have some burning with urination for 48-72 hours. You also may notice that you have to urinate more frequently or urgently after your procedure which is normal.  You should call should you develop an inability urinate, fever > 101, persistent nausea and vomiting that prevents you from eating or drinking to stay hydrated. If you have a catheter, you will be taught how to take care of the catheter by the nursing staff prior to discharge from the hospital.  You may periodically feel a strong urge to void with the catheter in place.  This is a bladder spasm and most often can occur when having a bowel movement or moving around. It is typically self-limited and usually will stop after a few minutes.  You may use some Vaseline or Neosporin around the tip of the catheter to reduce friction at the tip of the penis. You may also see some blood in the urine.  A very small amount of blood can make the urine look quite red.  As long as the catheter is draining well, there usually is not a problem.  However, if the catheter is not draining well and is bloody, you should call the office [phone]) to notify us.  It is okay to remove the catheter as instructed by the nurses on Friday morning if your urine is fairly clear.    No acetaminophen/Tylenol until after 12:00pm today if needed for pain.      Post Anesthesia Home Care Instructions  Activity: Get plenty of rest for the remainder of the day. A responsible individual must stay with you for 24 hours following the procedure.  For the next 24 hours, DO NOT: -Drive a car -Advertising copywriter -Drink alcoholic beverages -Take any medication unless instructed by your physician -Make any legal decisions or sign important papers.  Meals: Start with liquid foods such as gelatin or soup. Progress to regular foods as tolerated. Avoid greasy, spicy, heavy foods. If nausea and/or vomiting occur, drink only clear liquids  until the nausea and/or vomiting subsides. Call your physician if vomiting continues.  Special Instructions/Symptoms: Your throat may feel dry or sore from the anesthesia or the breathing tube placed in your throat during surgery. If this causes discomfort, gargle with warm salt water. The discomfort should disappear within 24 hours.

## 2021-01-18 NOTE — Transfer of Care (Signed)
Immediate Anesthesia Transfer of Care Note  Patient: ARJEN DERINGER  Procedure(s) Performed: TRANSURETHRAL RESECTION OF THE PROSTATE (TURP) (Prostate) CYSTOSCOPY WITH LITHOLAPAXY (Bladder)  Patient Location: PACU  Anesthesia Type:General  Level of Consciousness: awake, alert  and oriented  Airway & Oxygen Therapy: Patient Spontanous Breathing and Patient connected to nasal cannula oxygen  Post-op Assessment: Report given to RN and Post -op Vital signs reviewed and stable  Post vital signs: Reviewed and stable  Last Vitals:  Vitals Value Taken Time  BP 112/63 01/18/21 0816  Temp    Pulse 74 01/18/21 0817  Resp 19 01/18/21 0817  SpO2 96 % 01/18/21 0817  Vitals shown include unvalidated device data.  Last Pain:  Vitals:   01/18/21 0603  TempSrc: Oral  PainSc: 0-No pain      Patients Stated Pain Goal: 5 (01/18/21 0603)  Complications: No notable events documented.

## 2021-01-18 NOTE — Anesthesia Procedure Notes (Signed)
Procedure Name: LMA Insertion Date/Time: 01/18/2021 7:37 AM Performed by: Elyce Zollinger D, CRNA Pre-anesthesia Checklist: Patient identified, Emergency Drugs available, Suction available and Patient being monitored Patient Re-evaluated:Patient Re-evaluated prior to induction Oxygen Delivery Method: Circle system utilized Preoxygenation: Pre-oxygenation with 100% oxygen Induction Type: IV induction Ventilation: Mask ventilation without difficulty LMA: LMA inserted LMA Size: 4.0 Tube type: Oral Number of attempts: 1 Placement Confirmation: positive ETCO2 and breath sounds checked- equal and bilateral Tube secured with: Tape Dental Injury: Teeth and Oropharynx as per pre-operative assessment

## 2021-01-18 NOTE — Op Note (Signed)
Preoperative diagnosis: BPH with retention, bladder neck calculus  Postoperative diagnosis: Same  Principal procedure: Cystolitholapaxy of small 4 mm left bladder neck stone, TURP  Surgeon: Esteen Delpriore  Anesthesia: General with LMA  Complications: None  Specimen: Prostate chips, to pathology  Estimated blood loss: Less than 5 mL  Drains: 18 French Foley  Indications: 70 year old male with persistent urinary retention.  He underwent UroLift procedure in 2018.  More recently, he has had urinary retention precipitating self intermittent catheterization for adequate drainage.  Recent cystoscopy revealed a bladder neck stone at approximately 3:00, an obstructive median lobe, bladder trabeculations.  Follow-up urodynamics revealed mild to moderate bladder contractility, a large capacity bladder.  He does have urgency to go at times.  Because of his bladder neck stone with intermittent hematuria as well as an obstructive median lobe, he presents at this time for TURP and cystolitholapaxy.  I discussed the procedure with the patient and his wife, expected outcomes as well as risks and complications including but not limited to persistent urinary retention necessitating continued CIC, infection, bleeding, need for temporary catheterization, anesthetic complications.  He understands these and desires to proceed.  Findings: Urethra was free of lesions/strictures.  Prostate was moderately obstructive with a large median lobe.  Lateral lobes were not obstructive.  The stone was present at the 3 o'clock position at the bladder neck.  Once the stone was dislodged, tabs from the previous UroLift were present.  The bladder was of large capacity with moderate to severe trabeculations.  Ureteral orifices were in their normal location but the left ureteral orifice had a golf-hole configuration.  There were no urothelial lesions.  Description of procedure: The patient was properly identified in the holding area.  He  was taken to the operating room where general anesthetic was administered with the LMA.  He was placed in the dorsolithotomy position.  Genitalia and perineum were prepped, draped, proper timeout performed.  26 French resectoscope sheath was passed using the visual obturator.  The above-mentioned findings were noted.  Once circumferential/systematic inspection was performed, the stone at the bladder neck was dislodged with the beak of the scope.  Underlying capsular tab was noted, and grasped and extracted.  The stone within the bladder was also removed.  At this point, the resectoscope and cutting loop were placed.  Median lobe was resected down to the surgical capsule.  Resection was carried from the bladder neck all the way to the verumontanum which was left intact.  After this resection, the prostatic fossa was wide open.  Careful inspection and coagulation of all small bleeders was performed.  Chips were removed from the bladder and sent for pathology labeled "prostate chips".  Repeat inspection of the resected site with the irrigation off revealed no further bleeding.  At this point the scope was removed.  18 French Foley catheter was placed.  This was hooked to dependent drainage.  At this point, the procedure was terminated.  The patient was awakened, taken to the PACU in stable condition having tolerated the procedure well.

## 2021-01-19 ENCOUNTER — Encounter (HOSPITAL_BASED_OUTPATIENT_CLINIC_OR_DEPARTMENT_OTHER): Payer: Self-pay | Admitting: Urology

## 2021-01-19 LAB — SURGICAL PATHOLOGY

## 2021-05-28 IMAGING — CR DG RIBS W/ CHEST 3+V*L*
3 series · 3 of 3 positions shown · non-contrast
Comparison: Chest radiographs dated 01/15/2017

CLINICAL DATA: Left anterior rib pain x4 days, fall

EXAM:
LEFT RIBS AND CHEST - 3+ VIEW

[w chest pa]
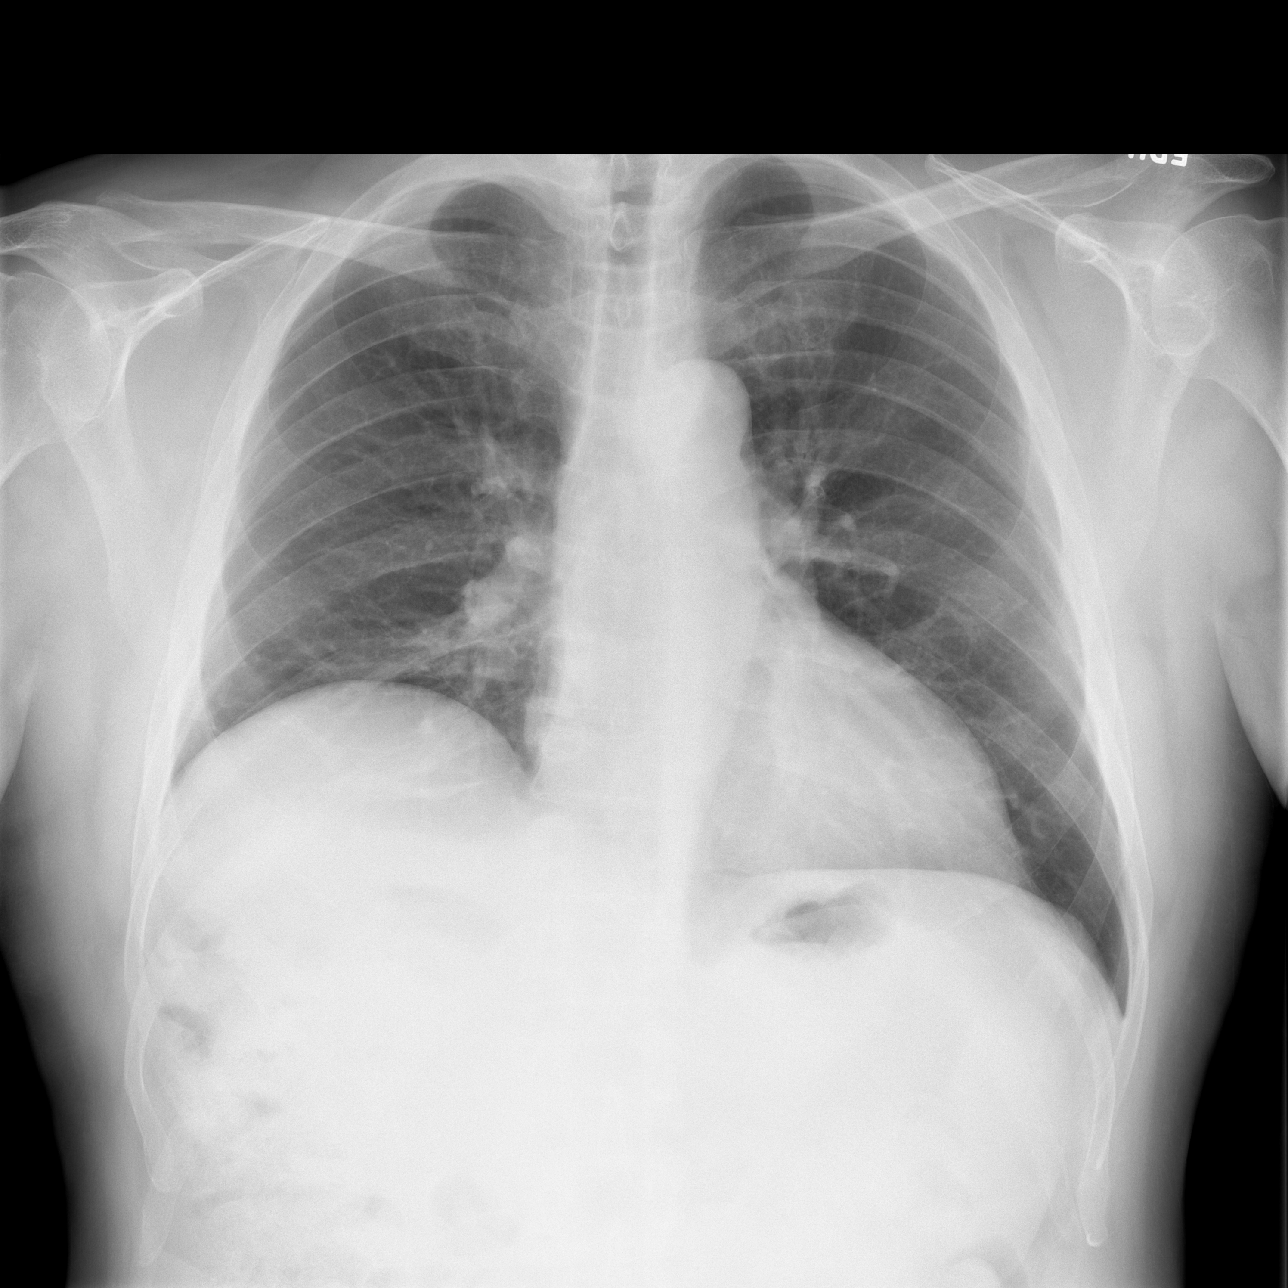

[w ribs ap/pa upper left *]
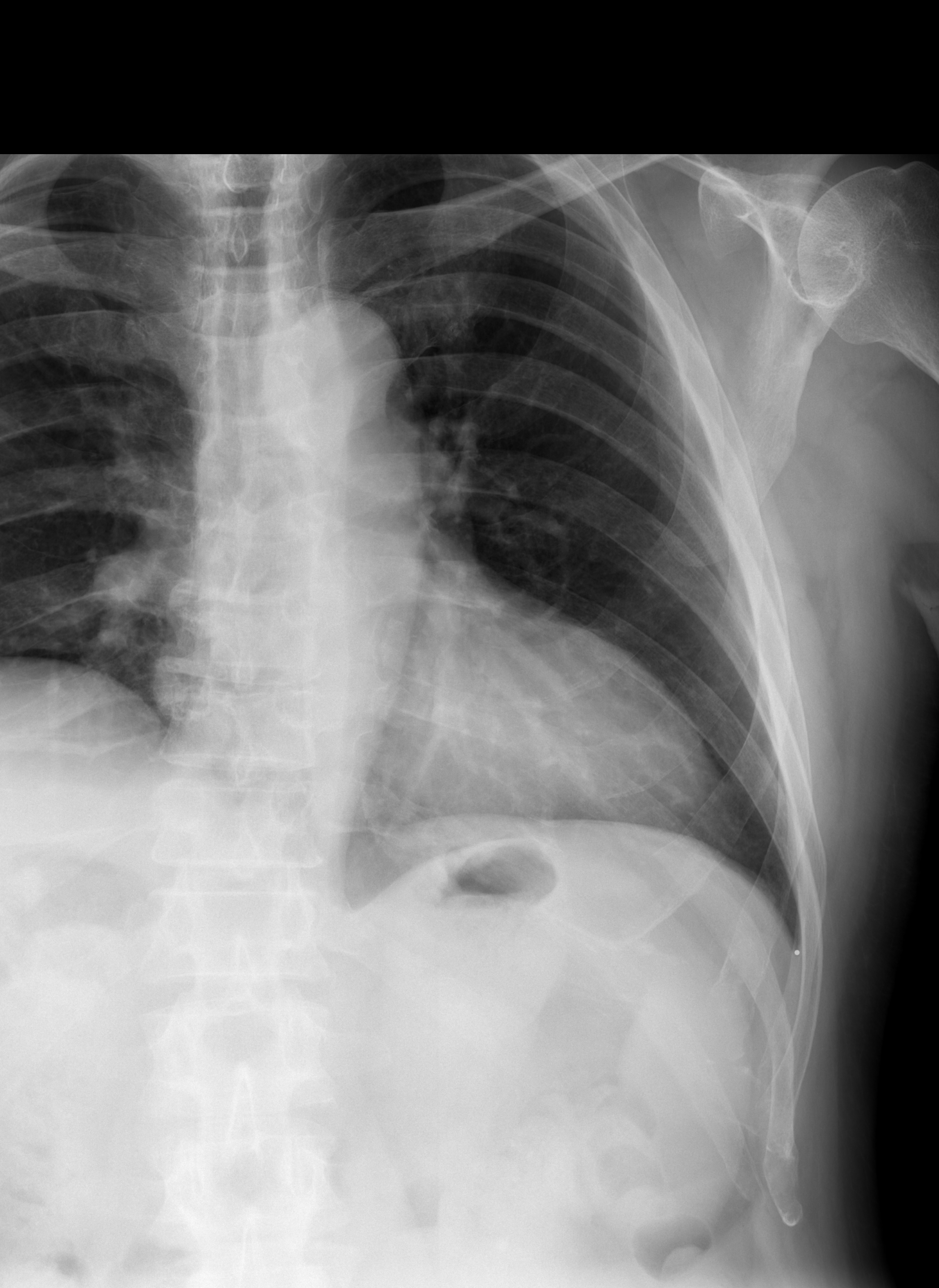

[w ribs oblique left *]
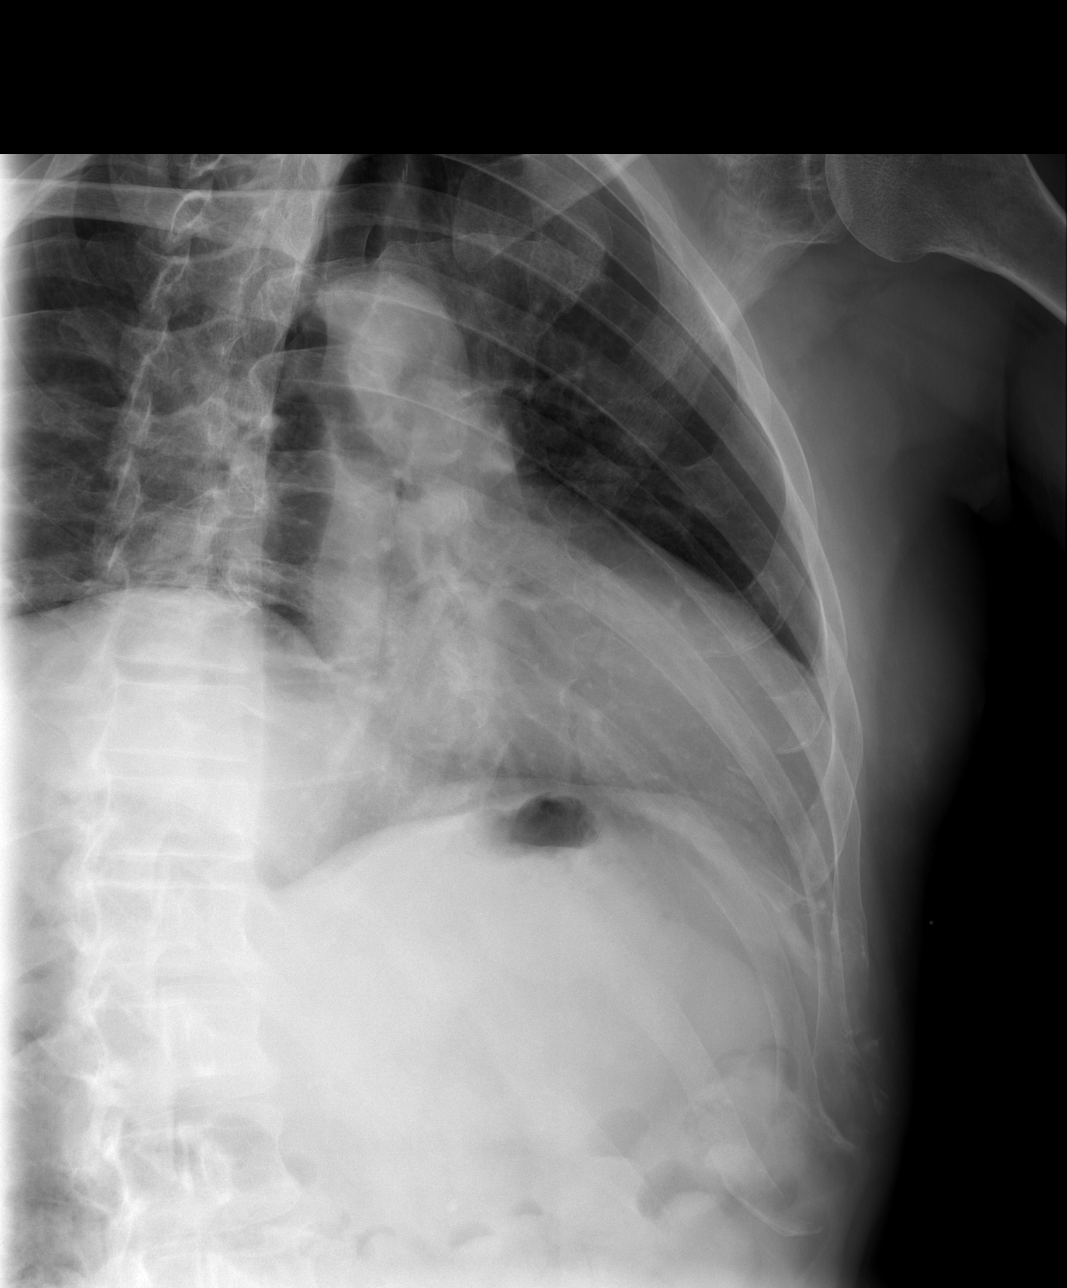

[3 of 3 positions shown; findings below may reference images not displayed]

FINDINGS: Lungs are clear.  No pleural effusion or pneumothorax.

The heart is normal in size.

No displaced left rib fracture is seen.
IMPRESSION: No evidence of acute cardiopulmonary disease.

No displaced left rib fracture is seen.

## 2022-12-17 ENCOUNTER — Other Ambulatory Visit: Payer: Self-pay | Admitting: Urology

## 2022-12-17 ENCOUNTER — Encounter (HOSPITAL_BASED_OUTPATIENT_CLINIC_OR_DEPARTMENT_OTHER): Payer: Self-pay | Admitting: Urology

## 2022-12-17 NOTE — Progress Notes (Signed)
Spoke w/ via phone for pre-op interview--- [name] needs dos---- ISTAT(GENT), EKG per anesthesia        Lab results------ COVID test -----patient states asymptomatic no test needed Arrive at -------1000 NPO after MN NO Solid Food.  Clear liquids from MN until---0900 Med rec completed Medications to take morning of surgery -----Xanax PRN, Norvasc and Atorvastatin Diabetic medication ----- Patient instructed no nail polish to be worn day of surgery Patient instructed to bring photo id and insurance card day of surgery Patient aware to have Driver (ride ) / caregiver    for 24 hours after surgery - Ricardo Little Patient Special Instructions ----- Pre-Op special Instructions ----- Patient verbalized understanding of instructions that were given at this phone interview. Patient denies chest pain, sob, fever, cough at the interview.

## 2022-12-26 ENCOUNTER — Encounter (HOSPITAL_BASED_OUTPATIENT_CLINIC_OR_DEPARTMENT_OTHER): Payer: Self-pay | Admitting: Urology

## 2022-12-26 ENCOUNTER — Ambulatory Visit (HOSPITAL_BASED_OUTPATIENT_CLINIC_OR_DEPARTMENT_OTHER): Payer: BC Managed Care – PPO | Admitting: Certified Registered Nurse Anesthetist

## 2022-12-26 ENCOUNTER — Ambulatory Visit (HOSPITAL_BASED_OUTPATIENT_CLINIC_OR_DEPARTMENT_OTHER)
Admission: RE | Admit: 2022-12-26 | Discharge: 2022-12-26 | Disposition: A | Discharge status: Home / Self Care | Payer: BC Managed Care – PPO | Attending: Urology | Admitting: Urology

## 2022-12-26 ENCOUNTER — Encounter (HOSPITAL_BASED_OUTPATIENT_CLINIC_OR_DEPARTMENT_OTHER)
Admission: RE | Disposition: A | Discharge status: Home / Self Care | Payer: Self-pay | Source: Home / Self Care | Attending: Urology

## 2022-12-26 ENCOUNTER — Other Ambulatory Visit: Payer: Self-pay

## 2022-12-26 DIAGNOSIS — N210 Calculus in bladder: Secondary | ICD-10-CM | POA: Diagnosis present

## 2022-12-26 DIAGNOSIS — I10 Essential (primary) hypertension: Secondary | ICD-10-CM | POA: Insufficient documentation

## 2022-12-26 DIAGNOSIS — N401 Enlarged prostate with lower urinary tract symptoms: Secondary | ICD-10-CM | POA: Insufficient documentation

## 2022-12-26 DIAGNOSIS — Z87442 Personal history of urinary calculi: Secondary | ICD-10-CM | POA: Diagnosis not present

## 2022-12-26 HISTORY — PX: CYSTOSCOPY WITH LITHOLAPAXY: SHX1425

## 2022-12-26 HISTORY — PX: CYSTOSCOPY/URETEROSCOPY/HOLMIUM LASER/STENT PLACEMENT: SHX6546

## 2022-12-26 LAB — POCT I-STAT, CHEM 8
BUN: 21 mg/dL (ref 8–23)
Calcium, Ion: 1.24 mmol/L (ref 1.15–1.40)
Chloride: 102 mmol/L (ref 98–111)
Creatinine, Ser: 1.1 mg/dL (ref 0.61–1.24)
Glucose, Bld: 93 mg/dL (ref 70–99)
HCT: 45 % (ref 39.0–52.0)
Hemoglobin: 15.3 g/dL (ref 13.0–17.0)
Potassium: 3.8 mmol/L (ref 3.5–5.1)
Sodium: 142 mmol/L (ref 135–145)
TCO2: 26 mmol/L (ref 22–32)

## 2022-12-26 SURGERY — CYSTOSCOPY, WITH BLADDER CALCULUS LITHOLAPAXY
Anesthesia: General | Site: Renal

## 2022-12-26 MED ORDER — HYOSCYAMINE SULFATE 0.125 MG PO TBDP: 0.1250 mg | ORAL_TABLET | Freq: Four times a day (QID) | ORAL | 0 refills | Status: AC | PRN

## 2022-12-26 MED ORDER — SODIUM CHLORIDE 0.9 % IV SOLN
INTRAVENOUS | Status: AC
  Filled 2022-12-26: qty 100

## 2022-12-26 MED ORDER — MIDAZOLAM HCL 2 MG/2ML IJ SOLN
INTRAMUSCULAR | Status: DC | PRN
  Administered 2022-12-26: 1 mg via INTRAVENOUS

## 2022-12-26 MED ORDER — ONDANSETRON HCL 4 MG/2ML IJ SOLN
INTRAMUSCULAR | Status: AC
  Filled 2022-12-26: qty 4

## 2022-12-26 MED ORDER — CEPHALEXIN 500 MG PO CAPS: 500.0000 mg | ORAL_CAPSULE | Freq: Once | ORAL | 0 refills | Status: AC

## 2022-12-26 MED ORDER — ALBUMIN HUMAN 5 % IV SOLN
INTRAVENOUS | Status: DC | PRN
Start: 2022-12-26 — End: 2022-12-26

## 2022-12-26 MED ORDER — AMPICILLIN SODIUM 1 G IJ SOLR
INTRAMUSCULAR | Status: AC
  Filled 2022-12-26: qty 1000

## 2022-12-26 MED ORDER — SODIUM CHLORIDE 0.9 % IR SOLN
Status: DC | PRN
  Administered 2022-12-26: 6000 mL

## 2022-12-26 MED ORDER — ONDANSETRON HCL 4 MG/2ML IJ SOLN
INTRAMUSCULAR | Status: DC | PRN
  Administered 2022-12-26: 4 mg via INTRAVENOUS

## 2022-12-26 MED ORDER — FENTANYL CITRATE (PF) 100 MCG/2ML IJ SOLN
INTRAMUSCULAR | Status: AC
  Filled 2022-12-26: qty 2

## 2022-12-26 MED ORDER — OXYCODONE HCL 5 MG PO TABS
ORAL_TABLET | ORAL | Status: AC
  Filled 2022-12-26: qty 1

## 2022-12-26 MED ORDER — SODIUM CHLORIDE 0.9 % IV SOLN
1.0000 g | INTRAVENOUS | Status: AC
  Administered 2022-12-26: 1 g via INTRAVENOUS

## 2022-12-26 MED ORDER — LIDOCAINE HCL (PF) 2 % IJ SOLN
INTRAMUSCULAR | Status: AC
  Filled 2022-12-26: qty 10

## 2022-12-26 MED ORDER — FENTANYL CITRATE (PF) 250 MCG/5ML IJ SOLN
INTRAMUSCULAR | Status: DC | PRN
  Administered 2022-12-26 (×2): 50 ug via INTRAVENOUS

## 2022-12-26 MED ORDER — GENTAMICIN SULFATE 40 MG/ML IJ SOLN
440.0000 mg | INTRAVENOUS | Status: AC
  Administered 2022-12-26: 440 mg via INTRAVENOUS
  Filled 2022-12-26: qty 11

## 2022-12-26 MED ORDER — PROPOFOL 10 MG/ML IV BOLUS
INTRAVENOUS | Status: DC | PRN
  Administered 2022-12-26: 150 mg via INTRAVENOUS

## 2022-12-26 MED ORDER — PHENAZOPYRIDINE HCL 100 MG PO TABS: 100.0000 mg | ORAL_TABLET | Freq: Three times a day (TID) | ORAL | 0 refills | Status: AC | PRN

## 2022-12-26 MED ORDER — DEXAMETHASONE SODIUM PHOSPHATE 10 MG/ML IJ SOLN
INTRAMUSCULAR | Status: AC
  Filled 2022-12-26: qty 2

## 2022-12-26 MED ORDER — LACTATED RINGERS IV SOLN: INTRAVENOUS | Status: DC

## 2022-12-26 MED ORDER — PHENYLEPHRINE 80 MCG/ML (10ML) SYRINGE FOR IV PUSH (FOR BLOOD PRESSURE SUPPORT)
PREFILLED_SYRINGE | INTRAVENOUS | Status: DC | PRN
  Administered 2022-12-26 (×5): 80 ug via INTRAVENOUS

## 2022-12-26 MED ORDER — ALFUZOSIN HCL ER 10 MG PO TB24: 10.0000 mg | ORAL_TABLET | Freq: Every day | ORAL | 0 refills | Status: AC

## 2022-12-26 MED ORDER — LACTATED RINGERS IV SOLN: INTRAVENOUS | Status: DC | PRN

## 2022-12-26 MED ORDER — DEXAMETHASONE SODIUM PHOSPHATE 10 MG/ML IJ SOLN
INTRAMUSCULAR | Status: DC | PRN
  Administered 2022-12-26: 10 mg via INTRAVENOUS

## 2022-12-26 MED ORDER — STERILE WATER FOR IRRIGATION IR SOLN
Status: DC | PRN
Start: 2022-12-26 — End: 2022-12-26
  Administered 2022-12-26: 500 mL

## 2022-12-26 MED ORDER — FENTANYL CITRATE (PF) 100 MCG/2ML IJ SOLN: 25.0000 ug | INTRAMUSCULAR | Status: DC | PRN

## 2022-12-26 MED ORDER — IOHEXOL 300 MG/ML  SOLN
INTRAMUSCULAR | Status: DC | PRN
  Administered 2022-12-26: 17 mL via URETHRAL

## 2022-12-26 MED ORDER — 0.9 % SODIUM CHLORIDE (POUR BTL) OPTIME
TOPICAL | Status: DC | PRN
Start: 2022-12-26 — End: 2022-12-26
  Administered 2022-12-26: 500 mL

## 2022-12-26 MED ORDER — ONDANSETRON HCL 4 MG/2ML IJ SOLN: 4.0000 mg | Freq: Once | INTRAMUSCULAR | Status: DC | PRN

## 2022-12-26 MED ORDER — OXYCODONE HCL 5 MG PO TABS
5.0000 mg | ORAL_TABLET | Freq: Once | ORAL | Status: AC | PRN
  Administered 2022-12-26: 5 mg via ORAL

## 2022-12-26 MED ORDER — MIDAZOLAM HCL 2 MG/2ML IJ SOLN
INTRAMUSCULAR | Status: AC
  Filled 2022-12-26: qty 2

## 2022-12-26 MED ORDER — OXYCODONE HCL 5 MG/5ML PO SOLN: 5.0000 mg | Freq: Once | ORAL | Status: AC | PRN

## 2022-12-26 MED ORDER — EPHEDRINE SULFATE (PRESSORS) 50 MG/ML IJ SOLN
INTRAMUSCULAR | Status: DC | PRN
Start: 2022-12-26 — End: 2022-12-26
  Administered 2022-12-26 (×4): 5 mg via INTRAVENOUS

## 2022-12-26 MED ORDER — PROPOFOL 10 MG/ML IV BOLUS
INTRAVENOUS | Status: AC
  Filled 2022-12-26: qty 20

## 2022-12-26 MED ORDER — LIDOCAINE 2% (20 MG/ML) 5 ML SYRINGE
INTRAMUSCULAR | Status: DC | PRN
  Administered 2022-12-26: 100 mg via INTRAVENOUS

## 2022-12-26 SURGICAL SUPPLY — 36 items
APL SKNCLS STERI-STRIP NONHPOA (GAUZE/BANDAGES/DRESSINGS)
BAG DRAIN URO-CYSTO SKYTR STRL (DRAIN) ×2 IMPLANT
BAG DRN RND TRDRP ANRFLXCHMBR (UROLOGICAL SUPPLIES) ×2
BAG DRN UROCATH (DRAIN) ×2
BAG URINE DRAIN 2000ML AR STRL (UROLOGICAL SUPPLIES) IMPLANT
BASKET STONE 1.7 NGAGE (UROLOGICAL SUPPLIES) IMPLANT
BASKET ZERO TIP NITINOL 2.4FR (BASKET) ×2 IMPLANT
BENZOIN TINCTURE PRP APPL 2/3 (GAUZE/BANDAGES/DRESSINGS) IMPLANT
BSKT STON RTRVL ZERO TP 2.4FR (BASKET)
CATH FOLEY 2WAY SLVR 5CC 18FR (CATHETERS) IMPLANT
CATH FOLEY 2WAY SLVR 5CC 24FR (CATHETERS) IMPLANT
CATH URETERAL DUAL LUMEN 10F (MISCELLANEOUS) IMPLANT
CATH URETL OPEN 5X70 (CATHETERS) IMPLANT
CLOTH BEACON ORANGE TIMEOUT ST (SAFETY) ×2 IMPLANT
FIBER LASER FLEXIVA 1000 (UROLOGICAL SUPPLIES) IMPLANT
GLOVE BIO SURGEON STRL SZ8 (GLOVE) IMPLANT
GOWN STRL REUS W/TWL XL LVL3 (GOWN DISPOSABLE) ×2 IMPLANT
GUIDEWIRE STR DUAL SENSOR (WIRE) IMPLANT
GUIDEWIRE ZIPWRE .038 STRAIGHT (WIRE) IMPLANT
HOLDER FOLEY CATH W/STRAP (MISCELLANEOUS) IMPLANT
IV NS IRRIG 3000ML ARTHROMATIC (IV SOLUTION) ×4 IMPLANT
KIT TURNOVER CYSTO (KITS) ×2 IMPLANT
LASER FIB FLEXIVA PULSE ID 365 (Laser) IMPLANT
MANIFOLD NEPTUNE II (INSTRUMENTS) ×2 IMPLANT
NS IRRIG 500ML POUR BTL (IV SOLUTION) ×2 IMPLANT
PACK CYSTO (CUSTOM PROCEDURE TRAY) ×2 IMPLANT
SLEEVE SCD COMPRESS KNEE MED (STOCKING) ×2 IMPLANT
STRIP CLOSURE SKIN 1/2X4 (GAUZE/BANDAGES/DRESSINGS) IMPLANT
SYR 10ML LL (SYRINGE) IMPLANT
SYR TOOMEY IRRIG 70ML (MISCELLANEOUS) ×2
SYRINGE TOOMEY IRRIG 70ML (MISCELLANEOUS) IMPLANT
TRACTIP FLEXIVA PULS ID 200XHI (Laser) IMPLANT
TRACTIP FLEXIVA PULSE ID 200 (Laser)
TUBE CONNECTING 12X1/4 (SUCTIONS) IMPLANT
TUBING UROLOGY SET (TUBING) ×2 IMPLANT
WATER STERILE IRR 500ML POUR (IV SOLUTION) IMPLANT

## 2022-12-26 NOTE — H&P (Signed)
CC/HPI: 72 year old male with a remote history of kidney stones in 2016 and 2014 requiring surgery. Patient also has a history of urinary retention requiring CIC, he then underwent UroLift and TURP in 2022 removal of 1.5 benign grams of tissue. He has not been seen since 2022.   Patient started having pain earlier this several weeks ago with gross hematuria. Pain was noted on the right side. Patient stated that he passed a stone earlier today. But is still having significant right-sided flank pain. He denies any nausea vomiting fevers or chills.   Denies any cardiac history, denies any pulmonary history, denies using any blood thinners   Patient is here today fpr cystolithopaxy and R URS w/ LL     ALLERGIES: Bactrim DS TABS Sulfa    MEDICATIONS: Viagra 100 mg tablet 1 tablet PO prn  Acetaminophen 500 mg tablet  Alprazolam 0.25 mg tablet  Amlodipine Besylate 5 mg tablet  Atorvastatin Calcium 10 mg tablet  Flaxseed Oil  Ibuprofen 200 mg tablet  Losartan Potassium 25 mg tablet  Magnesium 200 mg tablet     GU PSH: Catheterize For Residual - 2018 Complex cystometrogram, w/ void pressure and urethral pressure profile studies, any technique - 2022, 2018 Complex Uroflow - 2022, 2018 Cysto Bladder Stone <2.5cm - 2016 Cysto Uretero Lithotripsy - 2016, 2014 Cystoscopy - 2022, 2018 Cystoscopy Insert Stent - 2016, 2014 Cystoscopy TURP - 01/18/2021 Emg surf Electrd - 2022, 2018 Inject For cystogram - 2022, 2018 Insert Bladder Cath; Complex - 2022 Intrabd voidng Press - 2022, 2018 UROLIFT - 2018       PSH Notes: Wrist Surgery,   NON-GU PSH: Thyroid Surgery     GU PMH: BPH w/LUTS, Symptoms quite minimal at this time. Patient happy with voiding. Unable to void here in the office for urine. He does have fairly high residual but this is fairly stable for him. - 01/18/2022, Status post failed UroLift and eventual TURP. He has stable large residual urine volume which I think is his baseline  now. He was unable to void today in the office., - 07/20/2021, Over 3 months out from TURP. He is voiding a bit better and relying on self-catheterization less, - 2023, - 2023, - 02/09/2021, history of Urolift, now with urinary retention. He is on self catheterization., - 2022, - 2022, restart tams , - 2022, 4He has had significant symptomatic improvement with the Urolift although he does have a residual volume today is still high., - 2018, He is in retention. His catheter was removed. He does have mild to moderate obstruction with BPH although he does not have a huge gland., - 2018 (Stable), His symptoms are stable on tamsulosin., - 2018, BPH with obstruction/lower urinary tract symptoms, - 2016 ED due to arterial insufficiency, Mild, he would like a prescription for Viagra - 01/18/2022 Incomplete bladder emptying - 01/18/2022, Stable although he did not void before his residual urine volume check, - 07/20/2021, Residual volumes decreasing, - 2023, - 02/09/2021, He is on self catheterization, - 2022, - 2022, - 2022, - 2022, foley placed , - 2022 Bladder Stone, Status post cystolitholapaxy - 07/20/2021, - 02/09/2021, He does have a small to moderate size left proximal prostatic urethral calculus. Also with evidence of a bladder stone, probably within a diverticulum which I did not see on scope today., - 2022, - 2022, - 2022, - 2022, a little larger , - 2022 (Stable), Several smaller bladder calculi. These may well have been contributing to the hematuria and flank pain,  as he may have had the stones passed from the ureter to the bladder at the time of presentation a week ago., - 2018, Bladder calculus, - 2016 Acute Cystitis/UTI, Urinalysis is now clear - 2023, - 2023, - 2018 Urinary Retention - 2022, - 2018 Hydronephrosis, resolved with a bladder management - 2022, - 2022 (Stable), - 2022, - 2022, R>L likely related to retention, - 2022 Gross hematuria (Stable), likely bladder distention, possibly related to stone -  check cysto next avail - 2022, Gross hematuria, - 2016 Flank Pain - 2019, Diffuse Abdominal Pain, - 2014 Acute prostatitis - 2018 Bladder Diverticulum, 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 - 2018 Microscopic hematuria - 2018 Nocturia - 2018 Renal calculus (Stable), probable left renal calculi, stable/asymptomatic - 2018, - 2017, Nephrolithiasis, - 2016, Kidney stone on left side, - 2015 Encounter for Prostate Cancer screening - 2017 Dysuria, Dysuria - 2016 Abdominal Pain Unspec, Right flank pain - 2016 Urinary Retention, Unspec, Incomplete bladder emptying - 2016 History of urolithiasis, Nephrolithiasis - 2014 Ureteral calculus, Ureteral Stone - 2014, Calculus of distal right ureter, - 2014 Urinary Tract Inf, Unspec site, Pyuria - 2014    NON-GU PMH: Encounter for general adult medical examination without abnormal findings, Encounter for preventive health examination - 2016 Hyperthyroidism, Hyperthyroidism - 2014 Personal history of other diseases of the circulatory system, History of hypertension - 2014 Personal history of other endocrine, nutritional and metabolic disease, History of hypercholesterolemia - 2014    FAMILY HISTORY: Cancer - Father, Mother Death In The Family Father - Runs In Family Death In The Family Mother - Runs In Family   SOCIAL HISTORY: Marital Status: Married Ethnicity: Not Hispanic Or Latino; Race: White Quarry manager.  Does not drink caffeine.    REVIEW OF SYSTEMS:    GU Review Male:   Patient denies frequent urination, hard to postpone urination, burning/ pain with urination, get up at night to urinate, leakage of urine, stream starts and stops, trouble starting your stream, have to strain to urinate , erection problems, and penile pain.  Gastrointestinal (Upper):   Patient denies nausea, vomiting, and indigestion/ heartburn.  Gastrointestinal (Lower):   Patient denies diarrhea and constipation.  Constitutional:   Patient denies  fever, night sweats, weight loss, and fatigue.  Skin:   Patient denies skin rash/ lesion and itching.  Eyes:   Patient denies blurred vision and double vision.  Ears/ Nose/ Throat:   Patient denies sore throat and sinus problems.  Hematologic/Lymphatic:   Patient denies swollen glands and easy bruising.  Cardiovascular:   Patient denies leg swelling and chest pains.  Respiratory:   Patient denies cough and shortness of breath.  Endocrine:   Patient denies excessive thirst.  Musculoskeletal:   Patient denies back pain and joint pain.  Neurological:   Patient denies headaches and dizziness.  Psychologic:   Patient denies depression and anxiety.   VITAL SIGNS:      12/11/2022 09:27 AM  Weight 195 lb / 88.45 kg  Height 70 in / 177.8 cm  BP 123/66 mmHg  Pulse 71 /min  Temperature 97.5 F / 36.3 C  BMI 28.0 kg/m   MULTI-SYSTEM PHYSICAL EXAMINATION:    Constitutional: Well-nourished. No physical deformities. Normally developed. Good grooming.  Respiratory: No labored breathing, no use of accessory muscles.   Cardiovascular: Normal temperature, normal extremity pulses, no swelling, no varicosities.  Skin: No paleness, no jaundice, no cyanosis. No lesion, no ulcer, no rash.  Neurologic / Psychiatric: Oriented to time,  oriented to place, oriented to person. No depression, no anxiety, no agitation.  Musculoskeletal: Normal gait and station of head and neck.     Complexity of Data:  Records Review:   Previous Patient Records  Urine Test Review:   Urinalysis  X-Ray Review: C.T. Abdomen/Pelvis: Reviewed Films. Right distal ureteral stone, no right-sided hydro, significantly large bladder stone, left ureter demonstrates no stone after thorough review calcifications near the ureter are likely from the internal iliac after several views of multiple different planes.    11/15/16 08/30/15  PSA  Total PSA 0.56 ng/mL 0.68     PROCEDURES:         C.T. Urogram - [id number]      Patient confirmed No  Neulasta OnPro Device.           Urinalysis w/Scope Dipstick Dipstick Cont'd Micro  Color: Yellow Bilirubin: Neg mg/dL WBC/hpf: 10 - 16/XWR  Appearance: Slightly Cloudy Ketones: Neg mg/dL RBC/hpf: NS (Not Seen)  Specific Gravity: 1.010 Blood: 2+ ery/uL Bacteria: Many (>50/hpf)  pH: 7.5 Protein: Trace mg/dL Cystals: NS (Not Seen)  Glucose: Neg mg/dL Urobilinogen: 0.2 mg/dL Casts: NS (Not Seen)    Nitrites: Neg Trichomonas: Not Present    Leukocyte Esterase: 3+ leu/uL Mucous: Not Present      Epithelial Cells: NS (Not Seen)      Yeast: NS (Not Seen)      Sperm: Not Present    Notes: **QNS for spun micro**    ASSESSMENT:      ICD-10 Details  1 GU:   BPH w/LUTS - N40.1 Chronic  2   Ureteral calculus - N20.1 Undiagnosed New Problem   PLAN:           Orders Labs Urine Culture, BMP  X-Rays: C.T. Stone Protocol Without I.V. Contrast          Schedule         Document Letter(s):  Created for Patient: Clinical Summary         Notes:   Bladder stone: CT today reveals bladder stone possibly causing lower urinary tract symptoms.   Right distal ureteral stone: No hydro noted, pain improving. Will get BMP today as well as urine culture.      We discussed the risk benefits and alternatives to ureteroscopy and cystolithopaxy. This includes bleeding, infection, damage to surrounding structures including the urethra, bladder, ureter, and kidney. With these possible injuries resulting in need for intervention in the future. We discussed inability to remove all the stone and requiring follow-up ureteroscopy. We also discussed the possibility of not being able to gain access to the kidney and the need for nephrostomy tube. Possibility of long-term stent was also discussed. The patient voiced their understanding and would like to proceed.    Follow-up: Will be scheduled for surgery for cystolitholapaxy, and possible bilateral ureteroscopy with laser lithotripsy

## 2022-12-26 NOTE — Anesthesia Procedure Notes (Signed)
Procedure Name: LMA Insertion Date/Time: 12/26/2022 12:18 PM  Performed by: Dairl Ponder, CRNAPre-anesthesia Checklist: Patient identified, Emergency Drugs available, Suction available and Patient being monitored Patient Re-evaluated:Patient Re-evaluated prior to induction Oxygen Delivery Method: Circle System Utilized Preoxygenation: Pre-oxygenation with 100% oxygen Induction Type: IV induction Ventilation: Mask ventilation without difficulty LMA: LMA inserted LMA Size: 4.0 Number of attempts: 1 Airway Equipment and Method: Bite block Placement Confirmation: positive ETCO2 Tube secured with: Tape Dental Injury: Teeth and Oropharynx as per pre-operative assessment

## 2022-12-26 NOTE — Progress Notes (Deleted)
Post op discussion regarding nephrostomy tube.  Discussion with the family was held and the decision was made to the best option would be left nephrostomy tube.  We discussed the risks of thinking bleeding with nephrostomy tube and possible need to stay overnight.  This was discussed with the son as well as daughter.  This was witnessed by Johney Maine RN.

## 2022-12-26 NOTE — Transfer of Care (Signed)
Immediate Anesthesia Transfer of Care Note  Patient: Ricardo Little  Procedure(s) Performed: CYSTOSCOPY WITH LITHOLAPAXY (Bladder) CYSTOSCOPY, BILATERAL URETEROSCOPY, HOLMIUM LASER LITHOTRIPSY, AND BILATERAL URTERAL STENT PLACEMENT (Bilateral)  Patient Location: PACU  Anesthesia Type:General  Level of Consciousness: awake, alert , and oriented  Airway & Oxygen Therapy: Patient Spontanous Breathing  Post-op Assessment: Report given to RN and Post -op Vital signs reviewed and stable  Post vital signs: Reviewed and stable  Last Vitals:  Vitals Value Taken Time  BP    Temp    Pulse 75 12/26/22 1321  Resp 19 12/26/22 1321  SpO2 95 % 12/26/22 1321  Vitals shown include unfiled device data.  Last Pain:  Vitals:   12/26/22 1026  TempSrc: Oral  PainSc: 3       Patients Stated Pain Goal: 4 (12/26/22 1026)  Complications: No notable events documented.

## 2022-12-26 NOTE — Discharge Instructions (Addendum)
DISCHARGE INSTRUCTIONS FOR KIDNEY STONE/URETERAL STENT   MEDICATIONS:  1.  Resume all your other meds from home - except do not take any extra narcotic pain meds that you may have at home.  2. Pyridium is to help with the burning/stinging when you urinate. 3. Hyoscyamine for bladder spasm (can cause constipation so drink plenty of water) 4. Alfuzosin for stent pain has been sent   5. Take anitbiptic prescribed 1 hour prior to follow up   ACTIVITY:  1. No strenuous activity x 1week  2. No driving while on narcotic pain medications  3. Drink plenty of water  4. Continue to walk at home - it is normal to see blood in the urine while the stent is in place, so keep active, but don't over do it.  5. May return to work/school tomorrow or when you feel ready  6. You may experience some pain when urinating in the kidney on the side that was operated on while the stent is in place this is normal  BATHING:  1. You can shower and we recommend daily showers  2. You have a string coming from your urethra: The stent string is attached to your ureteral stent. Do not pull on this.   SIGNS/SYMPTOMS TO CALL:  Please call us if you have a fever greater than 101.5, uncontrolled nausea/vomiting, uncontrolled pain, dizziness, unable to urinate, bloody urine with clots greater than the size of a quarter, chest pain, shortness of breath, leg swelling, leg pain, redness around wound, drainage from wound, or any other concerns or questions.   You can reach [place] at [phone].   FOLLOW-UP:  1. You have an appointment in 6 weeks with a ultrasound of your kidneys prior.  You will be called to have you catheter removed early next week.   Foley Catheter Care A soft, flexible tube (Foley catheter) may have been placed in your bladder to drain urine and fluid. Follow these instructions: Taking Care of the Catheter Keep the area where the catheter leaves your body clean.  Attach the catheter to the leg so there is no  tension on the catheter.  Keep the drainage bag below the level of the bladder, but keep it OFF the floor.  Do not take long soaking baths. Your caregiver will give instructions about showering.  Wash your hands before touching ANYTHING related to the catheter or bag.  Using mild soap and warm water on a washcloth:  Clean the area closest to the catheter insertion site using a circular motion around the catheter.  Clean the catheter itself by wiping AWAY from the insertion site for several inches down the tube.  NEVER wipe upward as this could sweep bacteria up into the urethra (tube in your body that normally drains the bladder) and cause infection.  Place a small amount of sterile lubricant at the tip of the penis where the catheter is entering.  Taking Care of the Drainage Bags Two drainage bags may be taken home: a large overnight drainage bag, and a smaller leg bag which fits underneath clothing.  It is okay to wear the overnight bag at any time, but NEVER wear the smaller leg bag at night.  Keep the drainage bag well below the level of your bladder. This prevents backflow of urine into the bladder and allows the urine to drain freely.  Anchor the tubing to your leg to prevent pulling or tension on the catheter. Use tape or a leg strap provided by the hospital.  Empty the drainage bag when it is 1/2 to 3/4 full. Wash your hands before and after touching the bag.  Periodically check the tubing for kinks to make sure there is no pressure on the tubing which could restrict the flow of urine.  Changing the Drainage Bags Cleanse both ends of the clean bag with alcohol before changing.  Pinch off the rubber catheter to avoid urine spillage during the disconnection.  Disconnect the dirty bag and connect the clean one.  Empty the dirty bag carefully to avoid a urine spill.  Attach the new bag to the leg with tape or a leg strap.  Cleaning the Drainage Bags Whenever a drainage bag is disconnected,  it must be cleaned quickly so it is ready for the next use.  Wash the bag in warm, soapy water.  Rinse the bag thoroughly with warm water.  Soak the bag for 30 minutes in a solution of white vinegar and water (1 cup vinegar to 1 quart warm water).  Rinse with warm water.   IT Normal To See Some blood in the urine is normal, as long as you can see your fingers through the catheter tubing (not the bag) this is ok. As long as the urine is not the consistency of tomato paste.  It will be normal to feel the urge to urinate often this is a side effect of the catheter  Some discharge around the catheter where it exits the penis is normal  SEEK MEDICAL CARE IF:  You have chills or night sweats.  You are leaking around your catheter or have problems with your catheter. It is not uncommon to have sporadic leakage around your catheter as a result of bladder spasms. If the leakage stops, there is not much need for concern. If you are uncertain, call your caregiver.  You develop side effects that you think are coming from your medicines.  SEEK IMMEDIATE MEDICAL CARE IF:  You are suddenly unable to urinate. Check to see if there are any kinks in the drainage tubing that may cause this. If you cannot find any kinks, call your caregiver immediately. This is an emergency.  You develop shortness of breath or chest pains.  Bleeding persists or clots develop in your urine.  You have a fever.  You develop pain in your back or over your lower belly (abdomen).  You develop pain or swelling in your legs.  Any problems you are having get worse rather than better.  MAKE SURE YOU:  Understand these instructions.  Will watch your condition.  Will get help right away if you are not doing well or get worse.     Post Anesthesia Home Care Instructions  Activity: Get plenty of rest for the remainder of the day. A responsible individual must stay with you for 24 hours following the procedure.  For the next 24 hours, DO  NOT: -Drive a car -Advertising copywriter -Drink alcoholic beverages -Take any medication unless instructed by your physician -Make any legal decisions or sign important papers.  Meals: Start with liquid foods such as gelatin or soup. Progress to regular foods as tolerated. Avoid greasy, spicy, heavy foods. If nausea and/or vomiting occur, drink only clear liquids until the nausea and/or vomiting subsides. Call your physician if vomiting continues.  Special Instructions/Symptoms: Your throat may feel dry or sore from the anesthesia or the breathing tube placed in your throat during surgery. If this causes discomfort, gargle with warm salt water. The discomfort should disappear within  24 hours.

## 2022-12-26 NOTE — Op Note (Addendum)
Preoperative diagnosis: bladder stone and right ureteral calculus   Postoperative diagnosis: Bladder stone  Procedure:  Cystolithopaxy for stone burden greater than 2.5cm Right ureteroscopy  Right retrograde pyelogram    Surgeon: Dr. 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  EBL: Minimal  Specimens: None  Disposition of specimens: [facility] for stone analysis  Indication: Ricardo Little is a 72 y.o.   patient with a Right ureteral stone and bladder stone with associated symptoms.  After reviewing the management options for treatment, the patient elected to proceed with the above surgical procedure(s). We have discussed the potential benefits and risks of the procedure, side effects of the proposed treatment, the likelihood of the patient achieving the goals of the procedure, and any potential problems that might occur during the procedure or recuperation. Informed consent has been obtained.   Description of procedure:  Patient was taken to the operative room placed under anesthesia by the anesthesia team he was given preoperative antibiotics then placed in the dorsolithotomy station and prepped and draped in usual sterile fashion.  Timeout performed verifying correct patient procedure and the procedure began.  A 23 French cystoscope was then advanced through the urethra into the bladder findings as noted above.  The bladder was significantly trabeculated there was a large bladder stone measuring 4.5cm in diameter.  The cystoscope was exchanged for the laser bridge cystoscope.  1000 m  fiber was then used to break up the stone on settings of 1 J and 20 Hz.  The stone fragmented easily once all fragments were small enough to pass the scope they were evacuated using a [place] syringe through the scope.  Next the right ureteral orifice was identified and a 5 [place] Pollick was used to cannulate the ureter a retrograde pyelogram was performed demonstrating no filling defects or other abnormalities.  A sensor wire was then placed into the right ureter.  The cystoscope was then removed and a 4.5 French semirigid ureteroscope was then used to access the right ureter the ureter was then examined and there were no stones or other abnormalities noted.  The semirigid ureteroscope was then removed the wire was then backloaded onto the cystoscope and a 6 x 26 double-J stent with strings was placed in the right ureter.  An 33 French catheter was then placed in the bladder with 10 cc in the balloon the strings were then taped to the catheter.    Disposition: Patient was then taken to the PACU in stable condition with a 18 French catheter.  Patient will be discharged with follow-up early next week for cath removal and stent removal.

## 2022-12-26 NOTE — Anesthesia Preprocedure Evaluation (Signed)
Anesthesia Evaluation  Patient identified by MRN, date of birth, ID band Patient awake    Reviewed: Allergy & Precautions, H&P , NPO status , Patient's Chart, lab work & pertinent test results  Airway Mallampati: II  TM Distance: >3 FB Neck ROM: Full    Dental no notable dental hx.    Pulmonary neg pulmonary ROS   Pulmonary exam normal breath sounds clear to auscultation       Cardiovascular hypertension, Pt. on medications Normal cardiovascular exam Rhythm:Regular Rate:Normal     Neuro/Psych negative neurological ROS  negative psych ROS   GI/Hepatic negative GI ROS, Neg liver ROS,,,  Endo/Other  negative endocrine ROS    Renal/GU negative Renal ROS  negative genitourinary   Musculoskeletal negative musculoskeletal ROS (+)    Abdominal   Peds negative pediatric ROS (+)  Hematology negative hematology ROS (+)   Anesthesia Other Findings   Reproductive/Obstetrics negative OB ROS                             Anesthesia Physical Anesthesia Plan  ASA: 2  Anesthesia Plan: General   Post-op Pain Management: Minimal or no pain anticipated   Induction: Intravenous  PONV Risk Score and Plan: 2 and Ondansetron, Dexamethasone and Treatment may vary due to age or medical condition  Airway Management Planned: LMA  Additional Equipment:   Intra-op Plan:   Post-operative Plan: Extubation in OR  Informed Consent: I have reviewed the patients History and Physical, chart, labs and discussed the procedure including the risks, benefits and alternatives for the proposed anesthesia with the patient or authorized representative who has indicated his/her understanding and acceptance.     Dental advisory given  Plan Discussed with: CRNA and Surgeon  Anesthesia Plan Comments:        Anesthesia Quick Evaluation

## 2022-12-27 NOTE — Addendum Note (Signed)
Addendum  created 12/27/22 1610 by Dairl Ponder, CRNA   Intraprocedure Meds edited

## 2022-12-27 NOTE — Anesthesia Postprocedure Evaluation (Signed)
Anesthesia Post Note  Patient: Ricardo Little  Procedure(s) Performed: CYSTOSCOPY WITH LITHOLAPAXY (Bladder) CYSTOSCOPY, BILATERAL URETEROSCOPY, HOLMIUM LASER LITHOTRIPSY, AND RIGHT URETERAL STENT PLACEMENT (Bilateral: Renal)     Patient location during evaluation: Other Anesthesia Type: General Level of consciousness: awake and alert Pain management: pain level controlled Vital Signs Assessment: post-procedure vital signs reviewed and stable Respiratory status: spontaneous breathing, nonlabored ventilation, respiratory function stable and patient connected to nasal cannula oxygen Cardiovascular status: blood pressure returned to baseline and stable Postop Assessment: no apparent nausea or vomiting Anesthetic complications: no  No notable events documented.  Last Vitals:  Vitals:   12/26/22 1345 12/26/22 1440  BP: 119/76 121/88  Pulse: 64 64  Resp: 13 16  Temp:  (!) 36.4 C  SpO2: 97% 96%    Last Pain:  Vitals:   12/26/22 1440  TempSrc: Oral  PainSc: 3                  Nate Common S

## 2022-12-30 ENCOUNTER — Encounter (HOSPITAL_BASED_OUTPATIENT_CLINIC_OR_DEPARTMENT_OTHER): Payer: Self-pay | Admitting: Urology

## 2022-12-30 NOTE — Plan of Care (Signed)
[facility] Tonsillectomy/Adenoidectomy, Postoperative PEDS care plan entered in error.
